# Patient Record
Sex: Male | Born: 1965 | Race: White | Hispanic: No | Marital: Married | Smoking: Never smoker
Health system: Southern US, Community
[De-identification: ages and names within clinical notes are randomized; demographics above are authoritative.]

## PROBLEM LIST (undated history)

## (undated) DIAGNOSIS — L409 Psoriasis, unspecified: Secondary | ICD-10-CM

## (undated) DIAGNOSIS — I10 Essential (primary) hypertension: Secondary | ICD-10-CM

## (undated) DIAGNOSIS — K219 Gastro-esophageal reflux disease without esophagitis: Secondary | ICD-10-CM

## (undated) DIAGNOSIS — E785 Hyperlipidemia, unspecified: Secondary | ICD-10-CM

## (undated) HISTORY — PX: SKIN CANCER EXCISION: SHX779

## (undated) HISTORY — DX: Gastro-esophageal reflux disease without esophagitis: K21.9

## (undated) HISTORY — DX: Hyperlipidemia, unspecified: E78.5

## (undated) HISTORY — DX: Psoriasis, unspecified: L40.9

---
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
## 2005-10-21 ENCOUNTER — Ambulatory Visit: Payer: Self-pay | Admitting: Family Medicine

## 2007-11-04 ENCOUNTER — Ambulatory Visit: Payer: Self-pay | Admitting: Family Medicine

## 2007-11-04 LAB — CONVERTED CEMR LAB
Bilirubin Urine: NEGATIVE
Blood in Urine, dipstick: NEGATIVE
Glucose, Urine, Semiquant: NEGATIVE
Specific Gravity, Urine: 1.015
WBC Urine, dipstick: NEGATIVE

## 2007-11-10 ENCOUNTER — Ambulatory Visit: Payer: Self-pay | Admitting: Family Medicine

## 2007-11-15 LAB — CONVERTED CEMR LAB
AST: 18 units/L (ref 0–37)
Bilirubin, Direct: 0.2 mg/dL (ref 0.0–0.3)
Cholesterol: 189 mg/dL (ref 0–200)
Eosinophils Absolute: 0.1 10*3/uL (ref 0.0–0.6)
Eosinophils Relative: 3 % (ref 0.0–5.0)
GFR calc Af Amer: 120 mL/min
GFR calc non Af Amer: 99 mL/min
Glucose, Bld: 93 mg/dL (ref 70–99)
HCT: 44.3 % (ref 39.0–52.0)
Lymphocytes Relative: 30.8 % (ref 12.0–46.0)
MCV: 89.4 fL (ref 78.0–100.0)
Neutro Abs: 2.4 10*3/uL (ref 1.4–7.7)
Neutrophils Relative %: 54.5 % (ref 43.0–77.0)
Potassium: 4.6 meq/L (ref 3.5–5.1)
Sodium: 139 meq/L (ref 135–145)
TSH: 1.3 microintl units/mL (ref 0.35–5.50)
Triglycerides: 42 mg/dL (ref 0–149)
WBC: 4.4 10*3/uL — ABNORMAL LOW (ref 4.5–10.5)

## 2008-05-27 ENCOUNTER — Emergency Department (HOSPITAL_COMMUNITY): Admission: EM | Admit: 2008-05-27 | Discharge: 2008-05-27 | Payer: Self-pay | Admitting: Emergency Medicine

## 2008-08-21 ENCOUNTER — Ambulatory Visit: Payer: Self-pay | Admitting: Family Medicine

## 2008-08-21 LAB — CONVERTED CEMR LAB
Bilirubin Urine: NEGATIVE
Ketones, urine, test strip: NEGATIVE
Nitrite: NEGATIVE
Specific Gravity, Urine: 1.01
pH: 6.5

## 2008-08-23 LAB — CONVERTED CEMR LAB
ALT: 14 units/L (ref 0–53)
AST: 16 units/L (ref 0–37)
Albumin: 4.2 g/dL (ref 3.5–5.2)
Alkaline Phosphatase: 37 units/L — ABNORMAL LOW (ref 39–117)
BUN: 11 mg/dL (ref 6–23)
Basophils Absolute: 0 10*3/uL (ref 0.0–0.1)
Basophils Relative: 0.6 % (ref 0.0–3.0)
Bilirubin, Direct: 0.1 mg/dL (ref 0.0–0.3)
CO2: 32 meq/L (ref 19–32)
Calcium: 8.9 mg/dL (ref 8.4–10.5)
Chloride: 111 meq/L (ref 96–112)
Cholesterol: 149 mg/dL (ref 0–200)
Creatinine, Ser: 0.8 mg/dL (ref 0.4–1.5)
Eosinophils Absolute: 0.1 10*3/uL (ref 0.0–0.7)
Eosinophils Relative: 3.7 % (ref 0.0–5.0)
GFR calc Af Amer: 137 mL/min
GFR calc non Af Amer: 113 mL/min
Glucose, Bld: 92 mg/dL (ref 70–99)
HCT: 42.2 % (ref 39.0–52.0)
HDL: 28.8 mg/dL — ABNORMAL LOW (ref >39.0)
Hemoglobin: 14.3 g/dL (ref 13.0–17.0)
LDL Cholesterol: 110 mg/dL — ABNORMAL HIGH (ref 0–99)
Lymphocytes Relative: 43.4 % (ref 12.0–46.0)
MCHC: 33.9 g/dL (ref 30.0–36.0)
MCV: 91 fL (ref 78.0–100.0)
Monocytes Absolute: 0.4 10*3/uL (ref 0.1–1.0)
Monocytes Relative: 12.8 % — ABNORMAL HIGH (ref 3.0–12.0)
Neutro Abs: 1.2 10*3/uL — ABNORMAL LOW (ref 1.4–7.7)
Neutrophils Relative %: 39.5 % — ABNORMAL LOW (ref 43.0–77.0)
Platelets: 147 10*3/uL — ABNORMAL LOW (ref 150–400)
Potassium: 4.3 meq/L (ref 3.5–5.1)
RBC: 4.64 M/uL (ref 4.22–5.81)
RDW: 12.3 % (ref 11.5–14.6)
Sodium: 144 meq/L (ref 135–145)
TSH: 1.82 microintl units/mL (ref 0.35–5.50)
Total Bilirubin: 0.9 mg/dL (ref 0.3–1.2)
Total CHOL/HDL Ratio: 5.2
Total Protein: 6.7 g/dL (ref 6.0–8.3)
Triglycerides: 50 mg/dL (ref 0–149)
VLDL: 10 mg/dL (ref 0–40)
WBC: 3.1 10*3/uL — ABNORMAL LOW (ref 4.5–10.5)

## 2008-08-28 ENCOUNTER — Ambulatory Visit: Payer: Self-pay | Admitting: Family Medicine

## 2008-10-16 ENCOUNTER — Telehealth: Payer: Self-pay | Admitting: Family Medicine

## 2008-12-15 ENCOUNTER — Encounter: Payer: Self-pay | Admitting: Family Medicine

## 2009-04-03 ENCOUNTER — Ambulatory Visit: Payer: Self-pay | Admitting: Family Medicine

## 2009-04-03 DIAGNOSIS — J019 Acute sinusitis, unspecified: Secondary | ICD-10-CM | POA: Insufficient documentation

## 2009-11-13 ENCOUNTER — Ambulatory Visit: Payer: Self-pay | Admitting: Family Medicine

## 2009-11-16 LAB — CONVERTED CEMR LAB
Albumin: 4.5 g/dL (ref 3.5–5.2)
Alkaline Phosphatase: 45 units/L (ref 39–117)
Basophils Absolute: 0 10*3/uL (ref 0.0–0.1)
CO2: 32 meq/L (ref 19–32)
Calcium: 9.4 mg/dL (ref 8.4–10.5)
Cholesterol: 191 mg/dL (ref 0–200)
Creatinine, Ser: 0.9 mg/dL (ref 0.4–1.5)
Eosinophils Absolute: 0 10*3/uL (ref 0.0–0.7)
Glucose, Bld: 93 mg/dL (ref 70–99)
HDL: 38.2 mg/dL — ABNORMAL LOW (ref >39.00)
Lymphocytes Relative: 23.6 % (ref 12.0–46.0)
MCHC: 33.9 g/dL (ref 30.0–36.0)
MCV: 90.4 fL (ref 78.0–100.0)
Monocytes Absolute: 0.4 10*3/uL (ref 0.1–1.0)
Neutro Abs: 3.4 10*3/uL (ref 1.4–7.7)
Neutrophils Relative %: 66.6 % (ref 43.0–77.0)
RDW: 13 % (ref 11.5–14.6)
Triglycerides: 78 mg/dL (ref 0.0–149.0)

## 2009-11-19 ENCOUNTER — Ambulatory Visit: Payer: Self-pay | Admitting: Family Medicine

## 2009-11-19 DIAGNOSIS — E785 Hyperlipidemia, unspecified: Secondary | ICD-10-CM | POA: Insufficient documentation

## 2009-11-19 LAB — CONVERTED CEMR LAB
Bilirubin Urine: NEGATIVE
Glucose, Urine, Semiquant: NEGATIVE
Ketones, urine, test strip: NEGATIVE
Specific Gravity, Urine: >=1.03
Urobilinogen, UA: 0.2
pH: 5

## 2010-11-13 ENCOUNTER — Ambulatory Visit: Payer: Self-pay | Admitting: Family Medicine

## 2010-11-15 LAB — CONVERTED CEMR LAB
ALT: 22 units/L (ref 0–53)
BUN: 12 mg/dL (ref 6–23)
Bilirubin Urine: NEGATIVE
Bilirubin, Direct: 0.1 mg/dL (ref 0.0–0.3)
Blood, UA: NEGATIVE
CO2: 30 meq/L (ref 19–32)
Chloride: 105 meq/L (ref 96–112)
Cholesterol: 182 mg/dL (ref 0–200)
Creatinine, Ser: 0.9 mg/dL (ref 0.4–1.5)
Eosinophils Absolute: 0.1 10*3/uL (ref 0.0–0.7)
Eosinophils Relative: 3.4 % (ref 0.0–5.0)
Glucose, Bld: 96 mg/dL (ref 70–99)
HCT: 44.3 % (ref 39.0–52.0)
LDL Cholesterol: 141 mg/dL — ABNORMAL HIGH (ref 0–99)
Leukocytes, UA: NEGATIVE
Lymphs Abs: 1.3 10*3/uL (ref 0.7–4.0)
MCHC: 34.9 g/dL (ref 30.0–36.0)
MCV: 89.3 fL (ref 78.0–100.0)
Monocytes Absolute: 0.4 10*3/uL (ref 0.1–1.0)
Neutrophils Relative %: 49.2 % (ref 43.0–77.0)
Nitrite: NEGATIVE
PSA: 0.47 ng/mL (ref 0.10–4.00)
Platelets: 169 10*3/uL (ref 150.0–400.0)
Potassium: 4.5 meq/L (ref 3.5–5.1)
RDW: 13.7 % (ref 11.5–14.6)
TSH: 1.14 microintl units/mL (ref 0.35–5.50)
Total Bilirubin: 0.6 mg/dL (ref 0.3–1.2)
Total Protein, Urine: NEGATIVE mg/dL
Triglycerides: 35 mg/dL (ref 0.0–149.0)
pH: 6.5 (ref 5.0–8.0)

## 2010-11-20 ENCOUNTER — Ambulatory Visit: Payer: Self-pay | Admitting: Family Medicine

## 2010-12-30 ENCOUNTER — Encounter: Payer: Self-pay | Admitting: Family Medicine

## 2010-12-30 ENCOUNTER — Telehealth: Payer: Self-pay | Admitting: Family Medicine

## 2011-01-02 NOTE — Assessment & Plan Note (Signed)
Summary: CPX // RS/pt rescd from bump//ccm   Vital Signs:  Patient profile:   45 year old male Height:      73.5 inches Weight:      187 pounds BMI:     24.43 O2 Sat:      96 % Temp:     98.4 degrees F BP sitting:   114 / 70  (left arm) Cuff size:   regular  Vitals Entered By: Pura Spice, RN (November 20, 2010 2:36 PM) CC: cpx    History of Present Illness: 45 yr old male for a cpx. he feels well and has no complaints.   Allergies (verified): No Known Drug Allergies  Past History:  Past Medical History: Reviewed history from 11/19/2009 and no changes required. psoriasis Hyperlipidemia  Past Surgical History: Reviewed history from 11/10/2007 and no changes required. 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 Family History Hypertension Family History of Prostate CA 1st degree relative <50  Social History: Reviewed history from 08/28/2008 and no changes required. Married Never Smoked Alcohol use-yes Drug use-no Regular exercise-yes OccupationPublishing rights manager of physical education at [employer]  Review of Systems  The patient denies anorexia, fever, weight loss, weight gain, vision loss, decreased hearing, hoarseness, chest pain, syncope, dyspnea on exertion, peripheral edema, prolonged cough, headaches, hemoptysis, abdominal pain, melena, hematochezia, severe indigestion/heartburn, hematuria, incontinence, genital sores, muscle weakness, suspicious skin lesions, transient blindness, difficulty walking, depression, unusual weight change, abnormal bleeding, enlarged lymph nodes, angioedema, breast masses, and testicular masses.    Physical Exam  General:  Well-developed,well-nourished,in no acute distress; alert,appropriate and cooperative throughout examination Head:  Normocephalic and atraumatic without obvious abnormalities. No apparent alopecia or balding. Eyes:  No corneal  or conjunctival inflammation noted. EOMI. Perrla. Funduscopic exam benign, without hemorrhages, exudates or papilledema. Vision grossly normal. Ears:  External ear exam shows no significant lesions or deformities.  Otoscopic examination reveals clear canals, tympanic membranes are intact bilaterally without bulging, retraction, inflammation or discharge. Hearing is grossly normal bilaterally. Nose:  External nasal examination shows no deformity or inflammation. Nasal mucosa are pink and moist without lesions or exudates. Mouth:  Oral mucosa and oropharynx without lesions or exudates.  Teeth in good repair. Neck:  No deformities, masses, or tenderness noted. Chest Wall:  No deformities, masses, tenderness or gynecomastia noted. Lungs:  Normal respiratory effort, chest expands symmetrically. Lungs are clear to auscultation, no crackles or wheezes. Heart:  Normal rate and regular rhythm. S1 and S2 normal without gallop, murmur, click, rub or other extra sounds. Abdomen:  Bowel sounds positive,abdomen soft and non-tender without masses, organomegaly or hernias noted. Genitalia:  Testes bilaterally descended without nodularity, tenderness or masses. No scrotal masses or lesions. No penis lesions or urethral discharge. Msk:  No deformity or scoliosis noted of thoracic or lumbar spine.   Pulses:  R and L carotid,radial,femoral,dorsalis pedis and posterior tibial pulses are full and equal bilaterally Extremities:  No clubbing, cyanosis, edema, or deformity noted with normal full range of motion of all joints.   Neurologic:  No cranial nerve deficits noted. Station and gait are normal. Plantar reflexes are down-going bilaterally. DTRs are symmetrical throughout. Sensory, motor and coordinative functions appear intact. Skin:  Intact without suspicious lesions or rashes Cervical Nodes:  No lymphadenopathy noted Axillary Nodes:  No palpable lymphadenopathy Inguinal Nodes:  No significant adenopathy Psych:   Cognition and judgment appear intact. Alert and cooperative with normal attention span  and concentration. No apparent delusions, illusions, hallucinations   Impression & Recommendations:  Problem # 1:  WELL ADULT EXAM (ICD-V70.0)  Other Orders: Tdap => 27yrs IM (16109) Admin 1st Vaccine (60454)  Patient Instructions: 1)  Please schedule a follow-up appointment in 1 year.    Orders Added: 1)  Tdap => 68yrs IM [90715] 2)  Admin 1st Vaccine [90471] 3)  Est. Patient 40-64 years [09811]   Immunizations Administered:  Tetanus Vaccine:    Vaccine Type: Tdap    Site: left deltoid    Mfr: GlaxoSmithKline    Dose: 0.5 ml    Route: IM    Given by: Pura Spice, RN    Exp. Date: 09/19/2012    Lot #: [id number]    VIS given: 10/18/08 version given November 20, 2010.   Immunizations Administered:  Tetanus Vaccine:    Vaccine Type: Tdap    Site: left deltoid    Mfr: GlaxoSmithKline    Dose: 0.5 ml    Route: IM    Given by: Pura Spice, RN    Exp. Date: 09/19/2012    Lot #: [id number]    VIS given: 10/18/08 version given November 20, 2010.

## 2011-01-08 NOTE — Progress Notes (Signed)
Summary: Need letter of phyical date.  Phone Note Call from Patient   Caller: Patient Summary of Call: Patient is in need of a letter stating when his physical was completed. - He needs it for his insurance.  As per patient - this is his second request for this information.  Please call patient as soon as this is completed and he will pick it up.  [phone] - call if there is any problem.  Thanks Initial call taken by: Everrett Coombe,  December 30, 2010 1:28 PM  Follow-up for Phone Call        done, in your box  Follow-up by: Nelwyn Salisbury MD,  December 30, 2010 4:00 PM  Additional Follow-up for Phone Call Additional follow up Details #1::        copy made scanned  pt aware to pick up  Additional Follow-up by: Pura Spice, RN,  December 30, 2010 4:39 PM

## 2011-01-08 NOTE — Letter (Signed)
Summary: Proof of Physical Letter   Proof of Physical Letter   Imported By: Maryln Gottron 01/03/2011 10:09:21  _____________________________________________________________________  External Attachment:    Type:   Image     Comment:   External Document

## 2011-04-15 NOTE — Assessment & Plan Note (Signed)
[facility]                                 ON-CALL NOTE   NAME:WENGERMozes, Sagar                         MRN:          [id number]  DATE:05/27/2008                            DOB:          1966/01/07    Phone number is [phone]   OBJECTIVE:  The patient is short of breath for a couple of days, has had  tingling in the extremities.  His chest has been heavy with back  discomfort and shortness of breath.  Indeed sound short of breath on the  phone; claims his chest pain is exertional, sounds like he needs to go  the emergency room, which he was told to do.  Primary care Shacora Zynda is  Dr. Clent Ridges and home office is [place].     Arta Silence, MD  Electronically Signed    RNS/MedQ  DD: 05/27/2008  DT: 05/28/2008  Job #: [id number]

## 2011-08-28 LAB — DIFFERENTIAL
Basophils Absolute: 0
Basophils Relative: 0
Lymphocytes Relative: 11 — ABNORMAL LOW
Neutro Abs: 6.2
Neutrophils Relative %: 82 — ABNORMAL HIGH

## 2011-08-28 LAB — POCT I-STAT, CHEM 8
Calcium, Ion: 1.16
Glucose, Bld: 92
HCT: 47
Hemoglobin: 16
TCO2: 29

## 2011-08-28 LAB — CBC
MCHC: 34.1
RBC: 5.07
RDW: 13.3

## 2011-08-28 LAB — POCT CARDIAC MARKERS
CKMB, poc: 1.1
Myoglobin, poc: 72.5
Operator id: 196461
Troponin i, poc: <0.05

## 2011-11-17 ENCOUNTER — Other Ambulatory Visit (INDEPENDENT_AMBULATORY_CARE_PROVIDER_SITE_OTHER): Payer: 59

## 2011-11-17 DIAGNOSIS — Z0000 Encounter for general adult medical examination without abnormal findings: Secondary | ICD-10-CM

## 2011-11-17 LAB — BASIC METABOLIC PANEL
CO2: 28 mEq/L (ref 19–32)
Calcium: 8.9 mg/dL (ref 8.4–10.5)
Creatinine, Ser: 1 mg/dL (ref 0.4–1.5)
GFR: 91.32 mL/min (ref >60.00)

## 2011-11-17 LAB — HEPATIC FUNCTION PANEL
Alkaline Phosphatase: 47 U/L (ref 39–117)
Bilirubin, Direct: 0.1 mg/dL (ref 0.0–0.3)
Total Protein: 6.9 g/dL (ref 6.0–8.3)

## 2011-11-17 LAB — POCT URINALYSIS DIPSTICK
Blood, UA: NEGATIVE
Protein, UA: NEGATIVE
Spec Grav, UA: 1.02
Urobilinogen, UA: 0.2

## 2011-11-17 LAB — CBC WITH DIFFERENTIAL/PLATELET
Basophils Relative: 0.8 % (ref 0.0–3.0)
Eosinophils Absolute: 0.1 10*3/uL (ref 0.0–0.7)
Lymphocytes Relative: 33.3 % (ref 12.0–46.0)
MCHC: 34.3 g/dL (ref 30.0–36.0)
Monocytes Relative: 11.6 % (ref 3.0–12.0)
Neutrophils Relative %: 52 % (ref 43.0–77.0)
RBC: 4.79 Mil/uL (ref 4.22–5.81)
WBC: 3.6 10*3/uL — ABNORMAL LOW (ref 4.5–10.5)

## 2011-11-17 LAB — LIPID PANEL
HDL: 37.4 mg/dL — ABNORMAL LOW (ref >39.00)
LDL Cholesterol: 141 mg/dL — ABNORMAL HIGH (ref 0–99)
Total CHOL/HDL Ratio: 5
Triglycerides: 52 mg/dL (ref 0.0–149.0)

## 2011-11-20 NOTE — Progress Notes (Signed)
Quick Note:  Left voice message ______ 

## 2011-12-03 ENCOUNTER — Encounter: Payer: Self-pay | Admitting: Family Medicine

## 2011-12-04 ENCOUNTER — Ambulatory Visit (INDEPENDENT_AMBULATORY_CARE_PROVIDER_SITE_OTHER): Payer: 59 | Admitting: Family Medicine

## 2011-12-04 ENCOUNTER — Encounter: Payer: Self-pay | Admitting: Family Medicine

## 2011-12-04 VITALS — BP 120/84 | HR 58 | Temp 97.8°F | Ht 73.0 in | Wt 199.0 lb

## 2011-12-04 DIAGNOSIS — Z0000 Encounter for general adult medical examination without abnormal findings: Secondary | ICD-10-CM

## 2011-12-04 MED ORDER — ATORVASTATIN CALCIUM 20 MG PO TABS: 20.0000 mg | ORAL_TABLET | Freq: Every day | ORAL | Status: DC

## 2011-12-04 NOTE — Progress Notes (Signed)
  Subjective:    Patient ID: Sean Marshall, male    DOB: [birth year], 46 y.o.   MRN: [id number]  HPI 46 yr old male for a cpx. He feels fine and has no complaints. He has been taking fish oil capsules for the past 3 months, but he hates to take them because they are so large and hard to swallow. He does watch his diet.    Review of Systems  Constitutional: Negative.   HENT: Negative.   Eyes: Negative.   Respiratory: Negative.   Cardiovascular: Negative.   Gastrointestinal: Negative.   Genitourinary: Negative.   Musculoskeletal: Negative.   Skin: Negative.   Neurological: Negative.   Hematological: Negative.   Psychiatric/Behavioral: Negative.        Objective:   Physical Exam  Constitutional: He is oriented to person, place, and time. He appears well-developed and well-nourished. No distress.  HENT:  Head: Normocephalic and atraumatic.  Right Ear: External ear normal.  Left Ear: External ear normal.  Nose: Nose normal.  Mouth/Throat: Oropharynx is clear and moist. No oropharyngeal exudate.  Eyes: Conjunctivae and EOM are normal. Pupils are equal, round, and reactive to light. Right eye exhibits no discharge. Left eye exhibits no discharge. No scleral icterus.  Neck: Neck supple. No JVD present. No tracheal deviation present. No thyromegaly present.  Cardiovascular: Normal rate, regular rhythm, normal heart sounds and intact distal pulses.  Exam reveals no gallop and no friction rub.   No murmur heard. Pulmonary/Chest: Effort normal and breath sounds normal. No respiratory distress. He has no wheezes. He has no rales. He exhibits no tenderness.  Abdominal: Soft. Bowel sounds are normal. He exhibits no distension and no mass. There is no tenderness. There is no rebound and no guarding.  Genitourinary: Rectum normal, prostate normal and penis normal. Guaiac negative stool. No penile tenderness.  Musculoskeletal: Normal range of motion. He exhibits no edema and no tenderness.    Lymphadenopathy:    He has no cervical adenopathy.  Neurological: He is alert and oriented to person, place, and time. He has normal reflexes. No cranial nerve deficit. He exhibits normal muscle tone. Coordination normal.  Skin: Skin is warm and dry. No rash noted. He is not diaphoretic. No erythema. No pallor.  Psychiatric: He has a normal mood and affect. His behavior is normal. Judgment and thought content normal.          Assessment & Plan:  Well exam. We decided to stop the fish oil and to start on Lipitor. Recheck in 90 days

## 2012-01-23 ENCOUNTER — Other Ambulatory Visit: Payer: Self-pay | Admitting: Family Medicine

## 2012-03-02 ENCOUNTER — Other Ambulatory Visit: Payer: Self-pay | Admitting: Family Medicine

## 2012-03-04 ENCOUNTER — Telehealth: Payer: Self-pay | Admitting: Family Medicine

## 2012-03-04 DIAGNOSIS — E785 Hyperlipidemia, unspecified: Secondary | ICD-10-CM

## 2012-03-04 NOTE — Telephone Encounter (Signed)
Pt called and said that Dr Clent Ridges had mentioned about pt coming in about 3 month from last visit to get a recheck done on cholesterol and liver, due to a med he was on. Pls order the needed labs and advise is pt needs a fup ov after labs.

## 2012-03-07 NOTE — Telephone Encounter (Signed)
Please order a liver panel and lipids for 272.4. No OV is needed

## 2012-03-09 NOTE — Telephone Encounter (Signed)
Pt informed, future labs ordered 

## 2012-03-10 ENCOUNTER — Other Ambulatory Visit (INDEPENDENT_AMBULATORY_CARE_PROVIDER_SITE_OTHER): Payer: 59

## 2012-03-10 DIAGNOSIS — E785 Hyperlipidemia, unspecified: Secondary | ICD-10-CM

## 2012-03-10 LAB — LIPID PANEL
HDL: 37.5 mg/dL — ABNORMAL LOW (ref >39.00)
LDL Cholesterol: 65 mg/dL (ref 0–99)
Total CHOL/HDL Ratio: 3
VLDL: 7.6 mg/dL (ref 0.0–40.0)

## 2012-03-10 LAB — HEPATIC FUNCTION PANEL
AST: 30 U/L (ref 0–37)
Alkaline Phosphatase: 52 U/L (ref 39–117)

## 2012-03-12 ENCOUNTER — Telehealth: Payer: Self-pay | Admitting: Family Medicine

## 2012-03-12 NOTE — Telephone Encounter (Signed)
Pt called req to get lab results. Pls call.  °

## 2012-03-12 NOTE — Telephone Encounter (Signed)
Spoke with pt

## 2012-03-15 ENCOUNTER — Encounter: Payer: Self-pay | Admitting: Family Medicine

## 2012-03-15 NOTE — Progress Notes (Signed)
Quick Note:  I tried to reach pt by phone, no answer. I put a copy of results in mail. ______ 

## 2012-05-24 ENCOUNTER — Encounter: Payer: Self-pay | Admitting: Family Medicine

## 2012-05-24 ENCOUNTER — Ambulatory Visit (INDEPENDENT_AMBULATORY_CARE_PROVIDER_SITE_OTHER): Payer: 59 | Admitting: Family Medicine

## 2012-05-24 VITALS — BP 112/76 | HR 67 | Temp 98.5°F | Wt 192.0 lb

## 2012-05-24 DIAGNOSIS — J329 Chronic sinusitis, unspecified: Secondary | ICD-10-CM

## 2012-05-24 MED ORDER — AZITHROMYCIN 250 MG PO TABS: ORAL_TABLET | ORAL | Status: AC

## 2012-05-24 NOTE — Progress Notes (Signed)
  Subjective:    Patient ID: Sean Marshall, male    DOB: [birth year], 46 y.o.   MRN: [id number]  HPI Here for 3 weeks of sinus pressure, PND, ST, and coughing up green sputum. No fever. He has been taking some Prednisone he got from his wife. For the past 6 days he has been in a total of 40 mg a day.    Review of Systems  Constitutional: Negative.   HENT: Positive for congestion, postnasal drip and sinus pressure.   Eyes: Negative.   Respiratory: Positive for cough.        Objective:   Physical Exam  Constitutional: He appears well-developed and well-nourished.  HENT:  Right Ear: External ear normal.  Left Ear: External ear normal.  Nose: Nose normal.  Mouth/Throat: Oropharynx is clear and moist.  Eyes: Conjunctivae are normal.  Neck: No thyromegaly present.  Pulmonary/Chest: Effort normal and breath sounds normal.  Lymphadenopathy:    He has no cervical adenopathy.          Assessment & Plan:  Add a Zpack. He will taper off Prednisone by taking 20 mg a day for one week, then 10 mg a day for one week, then stop.

## 2012-05-28 ENCOUNTER — Ambulatory Visit (INDEPENDENT_AMBULATORY_CARE_PROVIDER_SITE_OTHER): Payer: 59 | Admitting: Family Medicine

## 2012-05-28 ENCOUNTER — Encounter: Payer: Self-pay | Admitting: Family Medicine

## 2012-05-28 VITALS — BP 112/68 | HR 76 | Temp 98.4°F | Wt 192.0 lb

## 2012-05-28 DIAGNOSIS — J45909 Unspecified asthma, uncomplicated: Secondary | ICD-10-CM

## 2012-05-28 MED ORDER — ALBUTEROL SULFATE HFA 108 (90 BASE) MCG/ACT IN AERS: 2.0000 | INHALATION_SPRAY | RESPIRATORY_TRACT | Status: DC | PRN

## 2012-06-07 ENCOUNTER — Encounter: Payer: Self-pay | Admitting: Family Medicine

## 2012-06-07 NOTE — Progress Notes (Signed)
  Subjective:    Patient ID: Sean Marshall, male    DOB: Aug 12, 1966, 46 y.o.   MRN: [id number]  HPI Here to follow up on a sinusitis diagnosed on 05-24-12. He was given a Zpack and he has been takinf some prednisone he got from his wife. He feels better in that the sinus pressure is gone and the PND is better. However he still has a lot of dry coughing and some wheezing. No fever. No hx of asthma as a child, but there is a strong family hx of asthma.    Review of Systems  Constitutional: Negative.   HENT: Negative.   Eyes: Negative.   Respiratory: Positive for cough, shortness of breath and wheezing. Negative for choking and chest tightness.   Cardiovascular: Negative.        Objective:   Physical Exam  Constitutional: He appears well-developed and well-nourished. No distress.  HENT:  Right Ear: External ear normal.  Left Ear: External ear normal.  Nose: Nose normal.  Mouth/Throat: Oropharynx is clear and moist. No oropharyngeal exudate.  Eyes: Conjunctivae are normal.  Neck: No thyromegaly present.  Cardiovascular: Normal rate, regular rhythm, normal heart sounds and intact distal pulses.   Pulmonary/Chest: Effort normal. No respiratory distress. He has no rales.       Soft scattered wheezes, no rhonchi  Lymphadenopathy:    He has no cervical adenopathy.          Assessment & Plan:  The sinsusitis [place] resolved but he seems to have some asthmatic bronchitis now. We will continue a slow prednisone taper and add an albuterol inhaler. Recheck prn

## 2013-01-26 ENCOUNTER — Telehealth: Payer: Self-pay | Admitting: Family Medicine

## 2013-01-26 MED ORDER — ATORVASTATIN CALCIUM 20 MG PO TABS: 20.0000 mg | ORAL_TABLET | Freq: Every day | ORAL | Status: DC

## 2013-01-26 NOTE — Telephone Encounter (Signed)
Refill request for Atorvastatin 20 mg and a 90 day supply, which I did send e-scribe

## 2013-04-28 ENCOUNTER — Other Ambulatory Visit (INDEPENDENT_AMBULATORY_CARE_PROVIDER_SITE_OTHER): Payer: 59

## 2013-04-28 DIAGNOSIS — Z0000 Encounter for general adult medical examination without abnormal findings: Secondary | ICD-10-CM

## 2013-04-28 LAB — POCT URINALYSIS DIPSTICK
Ketones, UA: NEGATIVE
Protein, UA: NEGATIVE
Spec Grav, UA: 1.02
Urobilinogen, UA: 1
pH, UA: 7

## 2013-04-28 LAB — BASIC METABOLIC PANEL
BUN: 11 mg/dL (ref 6–23)
Chloride: 102 mEq/L (ref 96–112)
GFR: 88.57 mL/min (ref >60.00)
Glucose, Bld: 98 mg/dL (ref 70–99)
Potassium: 4.7 mEq/L (ref 3.5–5.1)
Sodium: 136 mEq/L (ref 135–145)

## 2013-04-28 LAB — CBC WITH DIFFERENTIAL/PLATELET
Basophils Relative: 1 % (ref 0.0–3.0)
Eosinophils Relative: 1.7 % (ref 0.0–5.0)
Lymphocytes Relative: 31.9 % (ref 12.0–46.0)
MCV: 88.4 fl (ref 78.0–100.0)
Monocytes Absolute: 0.5 10*3/uL (ref 0.1–1.0)
Neutrophils Relative %: 52.9 % (ref 43.0–77.0)
Platelets: 149 10*3/uL — ABNORMAL LOW (ref 150.0–400.0)
RBC: 4.95 Mil/uL (ref 4.22–5.81)
WBC: 4.1 10*3/uL — ABNORMAL LOW (ref 4.5–10.5)

## 2013-04-28 LAB — HEPATIC FUNCTION PANEL
AST: 24 U/L (ref 0–37)
Albumin: 4.2 g/dL (ref 3.5–5.2)
Alkaline Phosphatase: 48 U/L (ref 39–117)
Total Protein: 6.9 g/dL (ref 6.0–8.3)

## 2013-04-28 LAB — LIPID PANEL
Cholesterol: 108 mg/dL (ref 0–200)
HDL: 41 mg/dL (ref >39.00)
LDL Cholesterol: 59 mg/dL (ref 0–99)
VLDL: 7.6 mg/dL (ref 0.0–40.0)

## 2013-04-29 NOTE — Progress Notes (Signed)
Quick Note:  Pt has appointment on 05/04/13 will go over then. ______ 

## 2013-05-04 ENCOUNTER — Encounter: Payer: Self-pay | Admitting: Family Medicine

## 2013-05-04 ENCOUNTER — Ambulatory Visit (INDEPENDENT_AMBULATORY_CARE_PROVIDER_SITE_OTHER): Payer: 59 | Admitting: Family Medicine

## 2013-05-04 VITALS — BP 108/74 | HR 68 | Temp 98.2°F | Ht 73.0 in | Wt 194.0 lb

## 2013-05-04 DIAGNOSIS — Z0000 Encounter for general adult medical examination without abnormal findings: Secondary | ICD-10-CM

## 2013-05-04 LAB — PSA: PSA: 0.53 ng/mL (ref 0.10–4.00)

## 2013-05-04 NOTE — Progress Notes (Signed)
  Subjective:    Patient ID: Sean Marshall, male    DOB: [birth year], 47 y.o.   MRN: [id number]  HPI 47 yr old male for a cpx. He feels fine and has no complaints.    Review of Systems  Constitutional: Negative.   HENT: Negative.   Eyes: Negative.   Respiratory: Negative.   Cardiovascular: Negative.   Gastrointestinal: Negative.   Genitourinary: Negative.   Musculoskeletal: Negative.   Skin: Negative.   Neurological: Negative.   Psychiatric/Behavioral: Negative.        Objective:   Physical Exam  Constitutional: He is oriented to person, place, and time. He appears well-developed and well-nourished. No distress.  HENT:  Head: Normocephalic and atraumatic.  Right Ear: External ear normal.  Left Ear: External ear normal.  Nose: Nose normal.  Mouth/Throat: Oropharynx is clear and moist. No oropharyngeal exudate.  Eyes: Conjunctivae and EOM are normal. Pupils are equal, round, and reactive to light. Right eye exhibits no discharge. Left eye exhibits no discharge. No scleral icterus.  Neck: Neck supple. No JVD present. No tracheal deviation present. No thyromegaly present.  Cardiovascular: Normal rate, regular rhythm, normal heart sounds and intact distal pulses.  Exam reveals no gallop and no friction rub.   No murmur heard. Pulmonary/Chest: Effort normal and breath sounds normal. No respiratory distress. He has no wheezes. He has no rales. He exhibits no tenderness.  Abdominal: Soft. Bowel sounds are normal. He exhibits no distension and no mass. There is no tenderness. There is no rebound and no guarding.  Genitourinary: Rectum normal, prostate normal and penis normal. Guaiac negative stool. No penile tenderness.  Musculoskeletal: Normal range of motion. He exhibits no edema and no tenderness.  Lymphadenopathy:    He has no cervical adenopathy.  Neurological: He is alert and oriented to person, place, and time. He has normal reflexes. No cranial nerve deficit. He exhibits normal  muscle tone. Coordination normal.  Skin: Skin is warm and dry. No rash noted. He is not diaphoretic. No erythema. No pallor.  Psychiatric: He has a normal mood and affect. His behavior is normal. Judgment and thought content normal.          Assessment & Plan:  Well exam. Get a PSA today due to his family hx of prostate cancer.

## 2013-05-05 NOTE — Progress Notes (Signed)
Quick Note:  I spoke with pt ______ 

## 2013-05-19 ENCOUNTER — Other Ambulatory Visit: Payer: Self-pay | Admitting: Family Medicine

## 2013-08-15 ENCOUNTER — Ambulatory Visit (INDEPENDENT_AMBULATORY_CARE_PROVIDER_SITE_OTHER): Payer: 59 | Admitting: Family Medicine

## 2013-08-15 ENCOUNTER — Encounter: Payer: Self-pay | Admitting: Family Medicine

## 2013-08-15 VITALS — BP 116/60 | HR 66 | Temp 98.0°F | Wt 199.0 lb

## 2013-08-15 DIAGNOSIS — L989 Disorder of the skin and subcutaneous tissue, unspecified: Secondary | ICD-10-CM

## 2013-08-15 NOTE — Progress Notes (Signed)
  Subjective:    Patient ID: Sean Marshall, male    DOB: 1966-06-07, 47 y.o.   MRN: [id number]  HPI Here to look at an irritated lesion on his back. He noticed it about 3 weeks ago, and it itches and burns. He scratched it off and it grew back.    Review of Systems  Constitutional: Negative.        Objective:   Physical Exam  Constitutional: He appears well-developed and well-nourished.  Skin:  He has an inflamed tan papular lesion on the upper back that appears to be a seborrheic keratosis           Assessment & Plan:  We will plan to excise this in the near future.

## 2013-08-18 ENCOUNTER — Other Ambulatory Visit: Payer: Self-pay | Admitting: Family Medicine

## 2013-08-19 ENCOUNTER — Encounter: Payer: Self-pay | Admitting: Family Medicine

## 2013-08-19 ENCOUNTER — Ambulatory Visit (INDEPENDENT_AMBULATORY_CARE_PROVIDER_SITE_OTHER): Payer: 59 | Admitting: Family Medicine

## 2013-08-19 VITALS — BP 118/60 | HR 62 | Temp 97.9°F | Wt 198.0 lb

## 2013-08-19 DIAGNOSIS — L989 Disorder of the skin and subcutaneous tissue, unspecified: Secondary | ICD-10-CM

## 2013-08-21 ENCOUNTER — Encounter: Payer: Self-pay | Admitting: Family Medicine

## 2013-08-21 NOTE — Progress Notes (Signed)
  Subjective:    Patient ID: Sean Marshall, male    DOB: Apr 11, 1966, 47 y.o.   MRN: [id number]  HPI Here to remove a lesion on the back. This appears to be an inflamed seborrheic keratosis. It measures about 1.7 cm in diameter.    Review of Systems  Constitutional: Negative.        Objective:   Physical Exam  Constitutional: He appears well-developed.          Assessment & Plan:  The area was cleansed with Betadine and LA was achieved using 2% Xylocaine with epinephrine. An elliptical incision was made around the lesion down to the subcutaneous fat layer with a scalpel. The lesion was removed and sent to Pathology. The wound was closed using 5 sutures of 4-0 Ethilon. It was dressed with Neosporin and a Bandaid. The sutures will be removed in 14 days

## 2013-08-24 NOTE — Progress Notes (Signed)
Quick Note:  I spoke with pt ______ 

## 2014-06-15 ENCOUNTER — Other Ambulatory Visit: Payer: Self-pay | Admitting: Family Medicine

## 2014-07-05 ENCOUNTER — Other Ambulatory Visit: Payer: 59

## 2014-07-05 ENCOUNTER — Other Ambulatory Visit (INDEPENDENT_AMBULATORY_CARE_PROVIDER_SITE_OTHER): Payer: 59

## 2014-07-05 DIAGNOSIS — Z0000 Encounter for general adult medical examination without abnormal findings: Secondary | ICD-10-CM

## 2014-07-05 LAB — CBC WITH DIFFERENTIAL/PLATELET
BASOS ABS: 0 10*3/uL (ref 0.0–0.1)
BASOS PCT: 0.6 % (ref 0.0–3.0)
EOS ABS: 0.1 10*3/uL (ref 0.0–0.7)
Eosinophils Relative: 2 % (ref 0.0–5.0)
HEMATOCRIT: 43.6 % (ref 39.0–52.0)
HEMOGLOBIN: 14.8 g/dL (ref 13.0–17.0)
LYMPHS ABS: 1.4 10*3/uL (ref 0.7–4.0)
Lymphocytes Relative: 33.6 % (ref 12.0–46.0)
MCHC: 34 g/dL (ref 30.0–36.0)
MCV: 88.1 fl (ref 78.0–100.0)
Monocytes Absolute: 0.4 10*3/uL (ref 0.1–1.0)
Monocytes Relative: 9.8 % (ref 3.0–12.0)
NEUTROS ABS: 2.2 10*3/uL (ref 1.4–7.7)
Neutrophils Relative %: 54 % (ref 43.0–77.0)
Platelets: 169 10*3/uL (ref 150.0–400.0)
RBC: 4.95 Mil/uL (ref 4.22–5.81)
RDW: 14.3 % (ref 11.5–15.5)
WBC: 4.1 10*3/uL (ref 4.0–10.5)

## 2014-07-05 LAB — POCT URINALYSIS DIPSTICK
Bilirubin, UA: NEGATIVE
Blood, UA: NEGATIVE
Glucose, UA: NEGATIVE
KETONES UA: NEGATIVE
Leukocytes, UA: NEGATIVE
Nitrite, UA: NEGATIVE
PH UA: 7
Protein, UA: NEGATIVE
Spec Grav, UA: 1.015
Urobilinogen, UA: 0.2

## 2014-07-05 LAB — BASIC METABOLIC PANEL
BUN: 14 mg/dL (ref 6–23)
CHLORIDE: 102 meq/L (ref 96–112)
CO2: 27 meq/L (ref 19–32)
Calcium: 8.8 mg/dL (ref 8.4–10.5)
Creatinine, Ser: 1 mg/dL (ref 0.4–1.5)
GFR: 89.17 mL/min (ref >60.00)
GLUCOSE: 92 mg/dL (ref 70–99)
POTASSIUM: 3.9 meq/L (ref 3.5–5.1)
SODIUM: 135 meq/L (ref 135–145)

## 2014-07-05 LAB — HEPATIC FUNCTION PANEL
ALK PHOS: 50 U/L (ref 39–117)
ALT: 22 U/L (ref 0–53)
AST: 20 U/L (ref 0–37)
Albumin: 4.1 g/dL (ref 3.5–5.2)
BILIRUBIN DIRECT: 0.1 mg/dL (ref 0.0–0.3)
TOTAL PROTEIN: 6.9 g/dL (ref 6.0–8.3)
Total Bilirubin: 0.8 mg/dL (ref 0.2–1.2)

## 2014-07-05 LAB — LIPID PANEL
CHOLESTEROL: 119 mg/dL (ref 0–200)
HDL: 34.8 mg/dL — ABNORMAL LOW (ref >39.00)
LDL CALC: 75 mg/dL (ref 0–99)
NonHDL: 84.2
Total CHOL/HDL Ratio: 3
Triglycerides: 48 mg/dL (ref 0.0–149.0)
VLDL: 9.6 mg/dL (ref 0.0–40.0)

## 2014-07-05 LAB — TSH: TSH: 1.28 u[IU]/mL (ref 0.35–4.50)

## 2014-07-12 ENCOUNTER — Telehealth: Payer: Self-pay | Admitting: Family Medicine

## 2014-07-12 NOTE — Telephone Encounter (Signed)
Pt would like his lab results  

## 2014-07-12 NOTE — Telephone Encounter (Signed)
I spoke with pt and went over results. 

## 2014-07-14 ENCOUNTER — Other Ambulatory Visit: Payer: 59

## 2014-07-18 ENCOUNTER — Ambulatory Visit (INDEPENDENT_AMBULATORY_CARE_PROVIDER_SITE_OTHER): Payer: 59 | Admitting: Family Medicine

## 2014-07-18 ENCOUNTER — Encounter: Payer: Self-pay | Admitting: Family Medicine

## 2014-07-18 VITALS — BP 111/72 | HR 67 | Temp 98.4°F | Ht 73.5 in | Wt 202.0 lb

## 2014-07-18 DIAGNOSIS — Z0000 Encounter for general adult medical examination without abnormal findings: Secondary | ICD-10-CM

## 2014-07-18 LAB — PSA: PSA: 0.58 ng/mL (ref 0.10–4.00)

## 2014-07-18 MED ORDER — ATORVASTATIN CALCIUM 20 MG PO TABS: ORAL_TABLET | ORAL | Status: DC

## 2014-07-18 NOTE — Progress Notes (Signed)
Pre visit review using our clinic review tool, if applicable. No additional management support is needed unless otherwise documented below in the visit note. 

## 2014-07-18 NOTE — Progress Notes (Signed)
   Subjective:    Patient ID: Sean DonathBrian D Marshall, male    DOB: 04/29/1966, 48 y.o.   MRN: [id number]  HPI 48 yr old male for a cpx. He feels well.    Review of Systems  Constitutional: Negative.   HENT: Negative.   Eyes: Negative.   Respiratory: Negative.   Cardiovascular: Negative.   Gastrointestinal: Negative.   Genitourinary: Negative.   Musculoskeletal: Negative.   Skin: Negative.   Neurological: Negative.   Psychiatric/Behavioral: Negative.        Objective:   Physical Exam  Constitutional: He is oriented to person, place, and time. He appears well-developed and well-nourished. No distress.  HENT:  Head: Normocephalic and atraumatic.  Right Ear: External ear normal.  Left Ear: External ear normal.  Nose: Nose normal.  Mouth/Throat: Oropharynx is clear and moist. No oropharyngeal exudate.  Eyes: Conjunctivae and EOM are normal. Pupils are equal, round, and reactive to light. Right eye exhibits no discharge. Left eye exhibits no discharge. No scleral icterus.  Neck: Neck supple. No JVD present. No tracheal deviation present. No thyromegaly present.  Cardiovascular: Normal rate, regular rhythm, normal heart sounds and intact distal pulses.  Exam reveals no gallop and no friction rub.   No murmur heard. Pulmonary/Chest: Effort normal and breath sounds normal. No respiratory distress. He has no wheezes. He has no rales. He exhibits no tenderness.  Abdominal: Soft. Bowel sounds are normal. He exhibits no distension and no mass. There is no tenderness. There is no rebound and no guarding.  Genitourinary: Rectum normal, prostate normal and penis normal. Guaiac negative stool. No penile tenderness.  Musculoskeletal: Normal range of motion. He exhibits no edema and no tenderness.  Lymphadenopathy:    He has no cervical adenopathy.  Neurological: He is alert and oriented to person, place, and time. He has normal reflexes. No cranial nerve deficit. He exhibits normal muscle tone.  Coordination normal.  Skin: Skin is warm and dry. No rash noted. He is not diaphoretic. No erythema. No pallor.  Psychiatric: He has a normal mood and affect. His behavior is normal. Judgment and thought content normal.          Assessment & Plan:  Well exam.

## 2014-12-12 ENCOUNTER — Encounter: Payer: Self-pay | Admitting: Family Medicine

## 2014-12-12 ENCOUNTER — Ambulatory Visit (INDEPENDENT_AMBULATORY_CARE_PROVIDER_SITE_OTHER): Payer: 59 | Admitting: Family Medicine

## 2014-12-12 VITALS — BP 121/79 | HR 61 | Temp 98.1°F | Ht 73.5 in | Wt 205.0 lb

## 2014-12-12 DIAGNOSIS — M542 Cervicalgia: Secondary | ICD-10-CM

## 2014-12-12 MED ORDER — DICLOFENAC SODIUM 75 MG PO TBEC: 75.0000 mg | DELAYED_RELEASE_TABLET | Freq: Two times a day (BID) | ORAL | Status: DC

## 2014-12-12 MED ORDER — CYCLOBENZAPRINE HCL 10 MG PO TABS: 10.0000 mg | ORAL_TABLET | Freq: Three times a day (TID) | ORAL | Status: DC | PRN

## 2014-12-12 NOTE — Progress Notes (Signed)
   Subjective:    Patient ID: Sean Marshall, male    DOB: 05/03/1966, 49 y.o.   MRN: [id number]  HPI Here for 2 months of stiffness and pain in the neck which started when he was pitching batting practice at work. After one pitch a line drive was hit toward his head and he instinctively jerked his head back to protect himself. As he did so he heard and felt a loud "crack" and he felt a sharp pain in the neck. This has bothered him ever since. Sometimes the pain radiates up over the top of his head and into both temples and he develops a full headache. Advil or aspirin helps the pain somewhat.    Review of Systems  Constitutional: Negative.   Musculoskeletal: Positive for neck pain and neck stiffness.  Neurological: Positive for headaches.       Objective:   Physical Exam  Constitutional: He is oriented to person, place, and time. He appears well-developed and well-nourished.  Eyes: Conjunctivae and EOM are normal. Pupils are equal, round, and reactive to light.  Neck: No thyromegaly present.  Musculoskeletal:  He is tender along the posterior neck and into the tops of both trapezei. There is a lot of spasm and he has pain with full extension of the neck. Otherwise ROM is full.   Lymphadenopathy:    He has no cervical adenopathy.  Neurological: He is alert and oriented to person, place, and time. No cranial nerve deficit.          Assessment & Plan:  He is having tension headaches form pinhed nerves in the neck. I suspect he has a herniated cervical disc. Try Diclofenac and Flexeril, add moist heat. Get Xrays of the cervical spine. Unless these show a major problem I would anticipate referring him to PT. If so he would like to see Art Schulenklopper who is a friend of his.

## 2014-12-12 NOTE — Progress Notes (Signed)
Pre visit review using our clinic review tool, if applicable. No additional management support is needed unless otherwise documented below in the visit note. 

## 2014-12-13 ENCOUNTER — Ambulatory Visit (INDEPENDENT_AMBULATORY_CARE_PROVIDER_SITE_OTHER)
Admission: RE | Admit: 2014-12-13 | Discharge: 2014-12-13 | Disposition: A | Discharge status: Home / Self Care | Payer: 59 | Source: Ambulatory Visit | Attending: Family Medicine | Admitting: Family Medicine

## 2014-12-13 DIAGNOSIS — M542 Cervicalgia: Secondary | ICD-10-CM

## 2014-12-18 NOTE — Addendum Note (Signed)
Addended by: Gershon CraneFRY, STEPHEN A on: 12/18/2014 10:22 AM   Modules accepted: Orders

## 2015-06-12 ENCOUNTER — Telehealth: Payer: Self-pay | Admitting: Family Medicine

## 2015-06-12 NOTE — Telephone Encounter (Signed)
Patient came in wanted a RX refill on Atorvastatin 20 mg. [place] over to [facility] [place]. Please notify patient when the refill has been authorize.

## 2015-06-15 MED ORDER — ATORVASTATIN CALCIUM 20 MG PO TABS: ORAL_TABLET | ORAL | Status: DC

## 2015-06-15 NOTE — Telephone Encounter (Signed)
Script was sent e-scribe and I spoke with pt. 

## 2015-07-16 ENCOUNTER — Other Ambulatory Visit (INDEPENDENT_AMBULATORY_CARE_PROVIDER_SITE_OTHER): Payer: 59

## 2015-07-16 DIAGNOSIS — Z0000 Encounter for general adult medical examination without abnormal findings: Secondary | ICD-10-CM | POA: Diagnosis not present

## 2015-07-16 LAB — BASIC METABOLIC PANEL
BUN: 15 mg/dL (ref 6–23)
CHLORIDE: 103 meq/L (ref 96–112)
CO2: 29 meq/L (ref 19–32)
Calcium: 9 mg/dL (ref 8.4–10.5)
Creatinine, Ser: 1 mg/dL (ref 0.40–1.50)
GFR: 84.7 mL/min (ref >60.00)
GLUCOSE: 101 mg/dL — AB (ref 70–99)
POTASSIUM: 4.3 meq/L (ref 3.5–5.1)
Sodium: 139 mEq/L (ref 135–145)

## 2015-07-16 LAB — HEPATIC FUNCTION PANEL
ALBUMIN: 4.4 g/dL (ref 3.5–5.2)
ALT: 21 U/L (ref 0–53)
AST: 17 U/L (ref 0–37)
Alkaline Phosphatase: 55 U/L (ref 39–117)
BILIRUBIN TOTAL: 0.7 mg/dL (ref 0.2–1.2)
Bilirubin, Direct: 0.2 mg/dL (ref 0.0–0.3)
TOTAL PROTEIN: 6.8 g/dL (ref 6.0–8.3)

## 2015-07-16 LAB — CBC WITH DIFFERENTIAL/PLATELET
BASOS PCT: 0.7 % (ref 0.0–3.0)
Basophils Absolute: 0 10*3/uL (ref 0.0–0.1)
EOS PCT: 3.9 % (ref 0.0–5.0)
Eosinophils Absolute: 0.2 10*3/uL (ref 0.0–0.7)
HCT: 46 % (ref 39.0–52.0)
Hemoglobin: 15.5 g/dL (ref 13.0–17.0)
Lymphocytes Relative: 32 % (ref 12.0–46.0)
Lymphs Abs: 1.5 10*3/uL (ref 0.7–4.0)
MCHC: 33.8 g/dL (ref 30.0–36.0)
MCV: 89.2 fl (ref 78.0–100.0)
MONO ABS: 0.5 10*3/uL (ref 0.1–1.0)
Monocytes Relative: 11.6 % (ref 3.0–12.0)
Neutro Abs: 2.4 10*3/uL (ref 1.4–7.7)
Neutrophils Relative %: 51.8 % (ref 43.0–77.0)
Platelets: 177 10*3/uL (ref 150.0–400.0)
RBC: 5.16 Mil/uL (ref 4.22–5.81)
RDW: 13.6 % (ref 11.5–15.5)
WBC: 4.7 10*3/uL (ref 4.0–10.5)

## 2015-07-16 LAB — POCT URINALYSIS DIPSTICK
BILIRUBIN UA: NEGATIVE
Glucose, UA: NEGATIVE
KETONES UA: NEGATIVE
Leukocytes, UA: NEGATIVE
Nitrite, UA: NEGATIVE
PH UA: 6.5
PROTEIN UA: NEGATIVE
RBC UA: NEGATIVE
SPEC GRAV UA: 1.015
Urobilinogen, UA: 0.2

## 2015-07-16 LAB — LIPID PANEL
CHOL/HDL RATIO: 4
Cholesterol: 119 mg/dL (ref 0–200)
HDL: 33.7 mg/dL — ABNORMAL LOW (ref >39.00)
LDL Cholesterol: 74 mg/dL (ref 0–99)
NONHDL: 85.27
Triglycerides: 54 mg/dL (ref 0.0–149.0)
VLDL: 10.8 mg/dL (ref 0.0–40.0)

## 2015-07-16 LAB — TSH: TSH: 1.69 u[IU]/mL (ref 0.35–4.50)

## 2015-07-16 LAB — PSA: PSA: 0.86 ng/mL (ref 0.10–4.00)

## 2015-07-23 ENCOUNTER — Encounter: Payer: Self-pay | Admitting: Family Medicine

## 2015-07-23 ENCOUNTER — Ambulatory Visit (INDEPENDENT_AMBULATORY_CARE_PROVIDER_SITE_OTHER): Payer: 59 | Admitting: Family Medicine

## 2015-07-23 VITALS — BP 122/73 | HR 68 | Temp 98.1°F | Ht 73.5 in | Wt 207.0 lb

## 2015-07-23 DIAGNOSIS — L989 Disorder of the skin and subcutaneous tissue, unspecified: Secondary | ICD-10-CM

## 2015-07-23 DIAGNOSIS — Z0000 Encounter for general adult medical examination without abnormal findings: Secondary | ICD-10-CM | POA: Diagnosis not present

## 2015-07-23 MED ORDER — ATORVASTATIN CALCIUM 20 MG PO TABS: ORAL_TABLET | ORAL | Status: DC

## 2015-07-23 MED ORDER — CYCLOBENZAPRINE HCL 10 MG PO TABS: 10.0000 mg | ORAL_TABLET | Freq: Three times a day (TID) | ORAL | Status: DC | PRN

## 2015-07-23 MED ORDER — TRIAMCINOLONE ACETONIDE 0.1 % EX CREA: 1.0000 "application " | TOPICAL_CREAM | Freq: Two times a day (BID) | CUTANEOUS | Status: DC

## 2015-07-23 NOTE — Progress Notes (Signed)
   Subjective:    Patient ID: Sean Marshall, male    DOB: Aug 21, 1966, 49 y.o.   MRN: [id number]  HPI 49 yr old male for a cpx. He has some issues to discuss. First he has had an itchy rash over the chin for several months. Second, he has had a number of skin lesions from sun damage over the years and asks me to cehcl these areas. Third, he has had frequent headaches in the past few months that always start on the right posterior nec. These then radiate up the neck to the right back of the head. No vision changes.    Review of Systems  Constitutional: Negative.   Eyes: Negative.   Respiratory: Negative.   Cardiovascular: Negative.   Gastrointestinal: Negative.   Genitourinary: Negative.   Musculoskeletal: Negative.   Skin: Positive for rash. Negative for color change, pallor and wound.  Neurological: Positive for headaches. Negative for dizziness, tremors, seizures, syncope, facial asymmetry, speech difficulty, weakness, light-headedness and numbness.  Psychiatric/Behavioral: Negative.        Objective:   Physical Exam  Constitutional: He is oriented to person, place, and time. He appears well-developed and well-nourished. No distress.  HENT:  Head: Normocephalic and atraumatic.  Right Ear: External ear normal.  Left Ear: External ear normal.  Nose: Nose normal.  Mouth/Throat: Oropharynx is clear and moist. No oropharyngeal exudate.  Eyes: Conjunctivae and EOM are normal. Pupils are equal, round, and reactive to light. Right eye exhibits no discharge. Left eye exhibits no discharge. No scleral icterus.  Neck: Neck supple. No JVD present. No tracheal deviation present. No thyromegaly present.  Cardiovascular: Normal rate, regular rhythm, normal heart sounds and intact distal pulses.  Exam reveals no gallop and no friction rub.   No murmur heard. Pulmonary/Chest: Effort normal and breath sounds normal. No respiratory distress. He has no wheezes. He has no rales. He exhibits no  tenderness.  Abdominal: Soft. Bowel sounds are normal. He exhibits no distension and no mass. There is no tenderness. There is no rebound and no guarding.  Genitourinary: Rectum normal, prostate normal and penis normal. Guaiac negative stool. No penile tenderness.  Musculoskeletal: Normal range of motion. He exhibits no edema or tenderness.  Lymphadenopathy:    He has no cervical adenopathy.  Neurological: He is alert and oriented to person, place, and time. He has normal reflexes. No cranial nerve deficit. He exhibits normal muscle tone. Coordination normal.  Skin: Skin is warm and dry. He is not diaphoretic. No erythema. No pallor.  Areas of macular erythema and scaling on the chin  Psychiatric: He has a normal mood and affect. His behavior is normal. Judgment and thought content normal.          Assessment & Plan:  Well exam. We discuseed diet and exercise advice. Refer to Dermatology for a skin exam. Use Triamcinolone cream to the chin. He has tension HAs so try heat with Flexeril and Ibuprofen.

## 2015-07-23 NOTE — Progress Notes (Signed)
Pre visit review using our clinic review tool, if applicable. No additional management support is needed unless otherwise documented below in the visit note. 

## 2015-09-08 ENCOUNTER — Other Ambulatory Visit: Payer: Self-pay | Admitting: Family Medicine

## 2015-10-05 ENCOUNTER — Encounter: Payer: Self-pay | Admitting: Family Medicine

## 2016-06-26 ENCOUNTER — Telehealth: Payer: Self-pay | Admitting: Family Medicine

## 2016-06-26 NOTE — Telephone Encounter (Signed)
Pt would like to add PSA to cpx labs.Can I sch?

## 2016-06-27 ENCOUNTER — Other Ambulatory Visit: Payer: Self-pay | Admitting: Family Medicine

## 2016-06-27 DIAGNOSIS — Z0000 Encounter for general adult medical examination without abnormal findings: Secondary | ICD-10-CM

## 2016-06-27 NOTE — Telephone Encounter (Signed)
I put in a future lab order and left a voice message for pt with this information.

## 2016-06-27 NOTE — Telephone Encounter (Signed)
Yes please add a PSA

## 2016-06-30 NOTE — Telephone Encounter (Signed)
psa added

## 2016-07-22 ENCOUNTER — Other Ambulatory Visit (INDEPENDENT_AMBULATORY_CARE_PROVIDER_SITE_OTHER): Payer: BLUE CROSS/BLUE SHIELD

## 2016-07-22 DIAGNOSIS — Z0000 Encounter for general adult medical examination without abnormal findings: Secondary | ICD-10-CM

## 2016-07-22 LAB — CBC WITH DIFFERENTIAL/PLATELET
BASOS ABS: 0 10*3/uL (ref 0.0–0.1)
Basophils Relative: 0.8 % (ref 0.0–3.0)
EOS ABS: 0.1 10*3/uL (ref 0.0–0.7)
Eosinophils Relative: 1.6 % (ref 0.0–5.0)
HCT: 43.9 % (ref 39.0–52.0)
Hemoglobin: 15 g/dL (ref 13.0–17.0)
LYMPHS ABS: 1.2 10*3/uL (ref 0.7–4.0)
LYMPHS PCT: 29.5 % (ref 12.0–46.0)
MCHC: 34.1 g/dL (ref 30.0–36.0)
MCV: 88.3 fl (ref 78.0–100.0)
MONOS PCT: 11.1 % (ref 3.0–12.0)
Monocytes Absolute: 0.4 10*3/uL (ref 0.1–1.0)
NEUTROS PCT: 57 % (ref 43.0–77.0)
Neutro Abs: 2.2 10*3/uL (ref 1.4–7.7)
Platelets: 181 10*3/uL (ref 150.0–400.0)
RBC: 4.98 Mil/uL (ref 4.22–5.81)
RDW: 13.7 % (ref 11.5–15.5)
WBC: 3.9 10*3/uL — ABNORMAL LOW (ref 4.0–10.5)

## 2016-07-22 LAB — POC URINALSYSI DIPSTICK (AUTOMATED)
Bilirubin, UA: NEGATIVE
Blood, UA: NEGATIVE
Glucose, UA: NEGATIVE
KETONES UA: NEGATIVE
LEUKOCYTES UA: NEGATIVE
NITRITE UA: NEGATIVE
PH UA: 7.5
PROTEIN UA: NEGATIVE
Spec Grav, UA: 1.015
UROBILINOGEN UA: 0.2

## 2016-07-22 LAB — LIPID PANEL
CHOLESTEROL: 110 mg/dL (ref 0–200)
HDL: 37.3 mg/dL — ABNORMAL LOW (ref >39.00)
LDL CALC: 64 mg/dL (ref 0–99)
NonHDL: 72.93
TRIGLYCERIDES: 43 mg/dL (ref 0.0–149.0)
Total CHOL/HDL Ratio: 3
VLDL: 8.6 mg/dL (ref 0.0–40.0)

## 2016-07-22 LAB — BASIC METABOLIC PANEL
BUN: 12 mg/dL (ref 6–23)
CALCIUM: 8.7 mg/dL (ref 8.4–10.5)
CO2: 31 mEq/L (ref 19–32)
CREATININE: 1.01 mg/dL (ref 0.40–1.50)
Chloride: 101 mEq/L (ref 96–112)
GFR: 83.38 mL/min (ref >60.00)
GLUCOSE: 100 mg/dL — AB (ref 70–99)
Potassium: 4.7 mEq/L (ref 3.5–5.1)
SODIUM: 138 meq/L (ref 135–145)

## 2016-07-22 LAB — HEPATIC FUNCTION PANEL
ALBUMIN: 4.6 g/dL (ref 3.5–5.2)
ALK PHOS: 51 U/L (ref 39–117)
ALT: 19 U/L (ref 0–53)
AST: 18 U/L (ref 0–37)
BILIRUBIN DIRECT: 0.2 mg/dL (ref 0.0–0.3)
Total Bilirubin: 0.9 mg/dL (ref 0.2–1.2)
Total Protein: 6.8 g/dL (ref 6.0–8.3)

## 2016-07-22 LAB — PSA: PSA: 0.46 ng/mL (ref 0.10–4.00)

## 2016-07-22 LAB — TSH: TSH: 1.51 u[IU]/mL (ref 0.35–4.50)

## 2016-07-29 ENCOUNTER — Encounter: Payer: Self-pay | Admitting: Family Medicine

## 2016-07-29 ENCOUNTER — Ambulatory Visit (INDEPENDENT_AMBULATORY_CARE_PROVIDER_SITE_OTHER): Payer: BLUE CROSS/BLUE SHIELD | Admitting: Family Medicine

## 2016-07-29 VITALS — BP 109/70 | HR 66 | Temp 98.2°F | Ht 73.5 in | Wt 202.0 lb

## 2016-07-29 DIAGNOSIS — Z0000 Encounter for general adult medical examination without abnormal findings: Secondary | ICD-10-CM

## 2016-07-29 MED ORDER — ATORVASTATIN CALCIUM 20 MG PO TABS: ORAL_TABLET | ORAL | 3 refills | Status: DC

## 2016-07-29 NOTE — Progress Notes (Signed)
   Subjective:    Patient ID: Sean Marshall, male    DOB: 08/27/1966, 50 y.o.   MRN: [id number]  HPI 50 yr old male for a well exam. He feels well. He saw Dr. Terri PiedraLupton and had a few skin lesions frozen off.   Review of Systems  Constitutional: Negative.   HENT: Negative.   Eyes: Negative.   Respiratory: Negative.   Cardiovascular: Negative.   Gastrointestinal: Negative.   Genitourinary: Negative.   Musculoskeletal: Negative.   Skin: Negative.   Neurological: Negative.   Psychiatric/Behavioral: Negative.        Objective:   Physical Exam  Constitutional: He is oriented to person, place, and time. He appears well-developed and well-nourished. No distress.  HENT:  Head: Normocephalic and atraumatic.  Right Ear: External ear normal.  Left Ear: External ear normal.  Nose: Nose normal.  Mouth/Throat: Oropharynx is clear and moist. No oropharyngeal exudate.  Eyes: Conjunctivae and EOM are normal. Pupils are equal, round, and reactive to light. Right eye exhibits no discharge. Left eye exhibits no discharge. No scleral icterus.  Neck: Neck supple. No JVD present. No tracheal deviation present. No thyromegaly present.  Cardiovascular: Normal rate, regular rhythm, normal heart sounds and intact distal pulses.  Exam reveals no gallop and no friction rub.   No murmur heard. Pulmonary/Chest: Effort normal and breath sounds normal. No respiratory distress. He has no wheezes. He has no rales. He exhibits no tenderness.  Abdominal: Soft. Bowel sounds are normal. He exhibits no distension and no mass. There is no tenderness. There is no rebound and no guarding.  Genitourinary: Rectum normal, prostate normal and penis normal. Rectal exam shows guaiac negative stool. No penile tenderness.  Musculoskeletal: Normal range of motion. He exhibits no edema or tenderness.  Lymphadenopathy:    He has no cervical adenopathy.  Neurological: He is alert and oriented to person, place, and time. He has normal  reflexes. No cranial nerve deficit. He exhibits normal muscle tone. Coordination normal.  Skin: Skin is warm and dry. No rash noted. He is not diaphoretic. No erythema. No pallor.  Psychiatric: He has a normal mood and affect. His behavior is normal. Judgment and thought content normal.          Assessment & Plan:  Well exam. We discussed diet and exercise.  Nelwyn SalisburyFRY,Azion Centrella A, MD

## 2016-07-29 NOTE — Progress Notes (Signed)
Pre visit review using our clinic review tool, if applicable. No additional management support is needed unless otherwise documented below in the visit note. 

## 2016-07-31 ENCOUNTER — Other Ambulatory Visit: Payer: Self-pay | Admitting: Family Medicine

## 2016-09-02 DIAGNOSIS — D1801 Hemangioma of skin and subcutaneous tissue: Secondary | ICD-10-CM | POA: Diagnosis not present

## 2016-09-02 DIAGNOSIS — L821 Other seborrheic keratosis: Secondary | ICD-10-CM | POA: Diagnosis not present

## 2016-09-02 DIAGNOSIS — L570 Actinic keratosis: Secondary | ICD-10-CM | POA: Diagnosis not present

## 2016-09-02 DIAGNOSIS — L814 Other melanin hyperpigmentation: Secondary | ICD-10-CM | POA: Diagnosis not present

## 2016-09-02 DIAGNOSIS — D225 Melanocytic nevi of trunk: Secondary | ICD-10-CM | POA: Diagnosis not present

## 2017-07-30 ENCOUNTER — Ambulatory Visit (INDEPENDENT_AMBULATORY_CARE_PROVIDER_SITE_OTHER): Payer: BLUE CROSS/BLUE SHIELD | Admitting: Family Medicine

## 2017-07-30 ENCOUNTER — Encounter: Payer: Self-pay | Admitting: Family Medicine

## 2017-07-30 VITALS — BP 105/85 | HR 63 | Temp 98.1°F | Ht 73.5 in | Wt 195.0 lb

## 2017-07-30 DIAGNOSIS — Z125 Encounter for screening for malignant neoplasm of prostate: Secondary | ICD-10-CM | POA: Diagnosis not present

## 2017-07-30 DIAGNOSIS — Z0000 Encounter for general adult medical examination without abnormal findings: Secondary | ICD-10-CM

## 2017-07-30 LAB — CBC WITH DIFFERENTIAL/PLATELET
BASOS PCT: 0.9 % (ref 0.0–3.0)
Basophils Absolute: 0 10*3/uL (ref 0.0–0.1)
EOS ABS: 0 10*3/uL (ref 0.0–0.7)
Eosinophils Relative: 1.2 % (ref 0.0–5.0)
HCT: 45.5 % (ref 39.0–52.0)
Hemoglobin: 15.5 g/dL (ref 13.0–17.0)
Lymphocytes Relative: 27.5 % (ref 12.0–46.0)
Lymphs Abs: 1.2 10*3/uL (ref 0.7–4.0)
MCHC: 34 g/dL (ref 30.0–36.0)
MCV: 90.5 fl (ref 78.0–100.0)
MONO ABS: 0.4 10*3/uL (ref 0.1–1.0)
Monocytes Relative: 10.1 % (ref 3.0–12.0)
NEUTROS ABS: 2.5 10*3/uL (ref 1.4–7.7)
NEUTROS PCT: 60.3 % (ref 43.0–77.0)
PLATELETS: 185 10*3/uL (ref 150.0–400.0)
RBC: 5.03 Mil/uL (ref 4.22–5.81)
RDW: 13.5 % (ref 11.5–15.5)
WBC: 4.2 10*3/uL (ref 4.0–10.5)

## 2017-07-30 LAB — HEPATIC FUNCTION PANEL
ALBUMIN: 4.6 g/dL (ref 3.5–5.2)
ALK PHOS: 54 U/L (ref 39–117)
ALT: 21 U/L (ref 0–53)
AST: 18 U/L (ref 0–37)
BILIRUBIN TOTAL: 1.1 mg/dL (ref 0.2–1.2)
Bilirubin, Direct: 0.2 mg/dL (ref 0.0–0.3)
Total Protein: 6.8 g/dL (ref 6.0–8.3)

## 2017-07-30 LAB — POC URINALSYSI DIPSTICK (AUTOMATED)
BILIRUBIN UA: NEGATIVE
Blood, UA: NEGATIVE
GLUCOSE UA: NEGATIVE
Ketones, UA: NEGATIVE
Leukocytes, UA: NEGATIVE
Nitrite, UA: NEGATIVE
Protein, UA: NEGATIVE
SPEC GRAV UA: 1.025 (ref 1.010–1.025)
UROBILINOGEN UA: 0.2 U/dL
pH, UA: 6 (ref 5.0–8.0)

## 2017-07-30 LAB — BASIC METABOLIC PANEL
BUN: 13 mg/dL (ref 6–23)
CHLORIDE: 102 meq/L (ref 96–112)
CO2: 32 mEq/L (ref 19–32)
Calcium: 9.3 mg/dL (ref 8.4–10.5)
Creatinine, Ser: 1.05 mg/dL (ref 0.40–1.50)
GFR: 79.39 mL/min (ref >60.00)
Glucose, Bld: 95 mg/dL (ref 70–99)
POTASSIUM: 4.6 meq/L (ref 3.5–5.1)
Sodium: 138 mEq/L (ref 135–145)

## 2017-07-30 LAB — LIPID PANEL
CHOL/HDL RATIO: 3
Cholesterol: 115 mg/dL (ref 0–200)
HDL: 37.4 mg/dL — ABNORMAL LOW (ref >39.00)
LDL CALC: 69 mg/dL (ref 0–99)
NONHDL: 77.8
TRIGLYCERIDES: 43 mg/dL (ref 0.0–149.0)
VLDL: 8.6 mg/dL (ref 0.0–40.0)

## 2017-07-30 LAB — TSH: TSH: 1.5 u[IU]/mL (ref 0.35–4.50)

## 2017-07-30 LAB — PSA: PSA: 1.22 ng/mL (ref 0.10–4.00)

## 2017-07-30 MED ORDER — ATORVASTATIN CALCIUM 20 MG PO TABS: ORAL_TABLET | ORAL | 3 refills | Status: DC

## 2017-07-30 NOTE — Progress Notes (Signed)
   Subjective:    Patient ID: Sean Marshall, male    DOB: 08/03/1966, 51 y.o.   MRN: [id number]  HPI Here for a well exam. He feels fine.    Review of Systems  Constitutional: Negative.   HENT: Negative.   Eyes: Negative.   Respiratory: Negative.   Cardiovascular: Negative.   Gastrointestinal: Negative.   Genitourinary: Negative.   Musculoskeletal: Negative.   Skin: Negative.   Neurological: Negative.   Psychiatric/Behavioral: Negative.        Objective:   Physical Exam  Constitutional: He is oriented to person, place, and time. He appears well-developed and well-nourished. No distress.  HENT:  Head: Normocephalic and atraumatic.  Right Ear: External ear normal.  Left Ear: External ear normal.  Nose: Nose normal.  Mouth/Throat: Oropharynx is clear and moist. No oropharyngeal exudate.  Eyes: Pupils are equal, round, and reactive to light. Conjunctivae and EOM are normal. Right eye exhibits no discharge. Left eye exhibits no discharge. No scleral icterus.  Neck: Neck supple. No JVD present. No tracheal deviation present. No thyromegaly present.  Cardiovascular: Normal rate, regular rhythm, normal heart sounds and intact distal pulses.  Exam reveals no gallop and no friction rub.   No murmur heard. Pulmonary/Chest: Effort normal and breath sounds normal. No respiratory distress. He has no wheezes. He has no rales. He exhibits no tenderness.  Abdominal: Soft. Bowel sounds are normal. He exhibits no distension and no mass. There is no tenderness. There is no rebound and no guarding.  Genitourinary: Rectum normal, prostate normal and penis normal. Rectal exam shows guaiac negative stool. No penile tenderness.  Musculoskeletal: Normal range of motion. He exhibits no edema or tenderness.  Lymphadenopathy:    He has no cervical adenopathy.  Neurological: He is alert and oriented to person, place, and time. He has normal reflexes. No cranial nerve deficit. He exhibits normal muscle  tone. Coordination normal.  Skin: Skin is warm and dry. No rash noted. He is not diaphoretic. No erythema. No pallor.  Psychiatric: He has a normal mood and affect. His behavior is normal. Judgment and thought content normal.          Assessment & Plan:  Well exam. We discussed diet and exercise. Get fasting labs. Refer for a colonoscopy.  Gershon CraneStephen Jemar Paulsen, MD

## 2017-07-30 NOTE — Patient Instructions (Signed)
WE NOW OFFER    [facility] FAST TRACK!!!  SAME DAY Appointments for ACUTE CARE  Such as: Sprains, Injuries, cuts, abrasions, rashes, muscle pain, joint pain, back pain Colds, flu, sore throats, headache, allergies, cough, fever  Ear pain, sinus and eye infections Abdominal pain, nausea, vomiting, diarrhea, upset stomach Animal/insect bites  3 Easy Ways to Schedule: Walk-In Scheduling Call in scheduling Mychart Sign-up: [email or web address]         

## 2017-08-20 ENCOUNTER — Encounter: Payer: Self-pay | Admitting: Family Medicine

## 2017-08-24 DIAGNOSIS — D1801 Hemangioma of skin and subcutaneous tissue: Secondary | ICD-10-CM | POA: Diagnosis not present

## 2017-08-24 DIAGNOSIS — L820 Inflamed seborrheic keratosis: Secondary | ICD-10-CM | POA: Diagnosis not present

## 2017-08-24 DIAGNOSIS — L570 Actinic keratosis: Secondary | ICD-10-CM | POA: Diagnosis not present

## 2017-08-31 ENCOUNTER — Encounter: Payer: Self-pay | Admitting: Family Medicine

## 2017-09-16 DIAGNOSIS — H524 Presbyopia: Secondary | ICD-10-CM | POA: Diagnosis not present

## 2017-09-21 ENCOUNTER — Encounter: Payer: Self-pay | Admitting: Gastroenterology

## 2017-10-14 ENCOUNTER — Other Ambulatory Visit: Payer: Self-pay

## 2017-10-14 ENCOUNTER — Ambulatory Visit: Payer: BLUE CROSS/BLUE SHIELD | Admitting: *Deleted

## 2017-10-14 VITALS — Ht 75.0 in | Wt 204.4 lb

## 2017-10-14 DIAGNOSIS — Z1211 Encounter for screening for malignant neoplasm of colon: Secondary | ICD-10-CM

## 2017-10-14 MED ORDER — NA SULFATE-K SULFATE-MG SULF 17.5-3.13-1.6 GM/177ML PO SOLN: ORAL | 0 refills | Status: DC

## 2017-10-14 NOTE — Progress Notes (Signed)
No allergies to eggs or soy. No problems with anesthesia.  Pt given Emmi instructions for colonoscopy  No oxygen use  No diet drug use  

## 2017-10-15 ENCOUNTER — Encounter: Payer: Self-pay | Admitting: Gastroenterology

## 2017-10-21 ENCOUNTER — Encounter: Payer: Self-pay | Admitting: Gastroenterology

## 2017-10-21 ENCOUNTER — Other Ambulatory Visit: Payer: Self-pay

## 2017-10-21 ENCOUNTER — Ambulatory Visit (AMBULATORY_SURGERY_CENTER): Payer: BLUE CROSS/BLUE SHIELD | Admitting: Gastroenterology

## 2017-10-21 VITALS — BP 125/91 | HR 53 | Temp 97.8°F | Resp 14 | Ht 73.0 in | Wt 195.0 lb

## 2017-10-21 DIAGNOSIS — K6289 Other specified diseases of anus and rectum: Secondary | ICD-10-CM

## 2017-10-21 DIAGNOSIS — K599 Functional intestinal disorder, unspecified: Secondary | ICD-10-CM | POA: Diagnosis not present

## 2017-10-21 DIAGNOSIS — Z1212 Encounter for screening for malignant neoplasm of rectum: Secondary | ICD-10-CM | POA: Diagnosis not present

## 2017-10-21 DIAGNOSIS — Z1211 Encounter for screening for malignant neoplasm of colon: Secondary | ICD-10-CM

## 2017-10-21 HISTORY — PX: COLONOSCOPY: SHX174

## 2017-10-21 MED ORDER — SODIUM CHLORIDE 0.9 % IV SOLN: 500.0000 mL | INTRAVENOUS | Status: AC

## 2017-10-21 NOTE — Progress Notes (Signed)
Report given to PACU, vss 

## 2017-10-21 NOTE — Patient Instructions (Signed)
YOU HAD AN ENDOSCOPIC PROCEDURE TODAY AT THE [facility]:   Refer to the procedure report that was given to you for any specific questions about what was found during the examination.  If the procedure report does not answer your questions, please call your gastroenterologist to clarify.  If you requested that your care partner not be given the details of your procedure findings, then the procedure report has been included in a sealed envelope for you to review at your convenience later.  YOU SHOULD EXPECT: Some feelings of bloating in the abdomen. Passage of more gas than usual.  Walking can help get rid of the air that was put into your GI tract during the procedure and reduce the bloating. If you had a lower endoscopy (such as a colonoscopy or flexible sigmoidoscopy) you may notice spotting of blood in your stool or on the toilet paper. If you underwent a bowel prep for your procedure, you may not have a normal bowel movement for a few days.  Please Note:  You might notice some irritation and congestion in your nose or some drainage.  This is from the oxygen used during your procedure.  There is no need for concern and it should clear up in a day or so.  SYMPTOMS TO REPORT IMMEDIATELY:   Following lower endoscopy (colonoscopy or flexible sigmoidoscopy):  Excessive amounts of blood in the stool  Significant tenderness or worsening of abdominal pains  Swelling of the abdomen that is new, acute  Fever of 100F or higher   For urgent or emergent issues, a gastroenterologist can be reached at any hour by calling [phone].   DIET:  We do recommend a small meal at first, but then you may proceed to your regular diet.  Drink plenty of fluids but you should avoid alcoholic beverages for 24 hours.  ACTIVITY:  You should plan to take it easy for the rest of today and you should NOT DRIVE or use heavy machinery until tomorrow (because of the sedation medicines used during the test).     FOLLOW UP: Our staff will call the number listed on your records the next business day following your procedure to check on you and address any questions or concerns that you may have regarding the information given to you following your procedure. If we do not reach you, we will leave a message.  However, if you are feeling well and you are not experiencing any problems, there is no need to return our call.  We will assume that you have returned to your regular daily activities without incident.  If any biopsies were taken you will be contacted by phone or by letter within the next 1-3 weeks.  Please call us at [phone] if you have not heard about the biopsies in 3 weeks.    SIGNATURES/CONFIDENTIALITY: You and/or your care partner have signed paperwork which will be entered into your electronic medical record.  These signatures attest to the fact that that the information above on your After Visit Summary has been reviewed and is understood.  Full responsibility of the confidentiality of this discharge information lies with you and/or your care-partner.   Resume medications.  

## 2017-10-21 NOTE — Op Note (Signed)
[facility] Patient Name: Sean HellingBrian Marshall Procedure Date: 10/21/2017 10:03 AM MRN: [id number] Endoscopist: Rachael Feeaniel P Vonnie Spagnolo , MD Age: [age] Referring MD:  Date of Birth: 07/22/1966 Gender: Male Account #: [id number] Procedure:                Colonoscopy Indications:              Screening for colorectal malignant neoplasm Medicines:                Monitored Anesthesia Care Procedure:                Pre-Anesthesia Assessment:                           - Prior to the procedure, a History and Physical                            was performed, and patient medications and                            allergies were reviewed. The patient's tolerance of                            previous anesthesia was also reviewed. The risks                            and benefits of the procedure and the sedation                            options and risks were discussed with the patient.                            All questions were answered, and informed consent                            was obtained. Prior Anticoagulants: The patient has                            taken no previous anticoagulant or antiplatelet                            agents. ASA Grade Assessment: II - A patient with                            mild systemic disease. After reviewing the risks                            and benefits, the patient was deemed in                            satisfactory condition to undergo the procedure.                           After obtaining informed consent, the colonoscope  was passed under direct vision. Throughout the                            procedure, the patient's blood pressure, pulse, and                            oxygen saturations were monitored continuously. The                            Colonoscope was introduced through the anus and                            advanced to the the cecum, identified by                            appendiceal orifice and  ileocecal valve. The                            colonoscopy was performed without difficulty. The                            patient tolerated the procedure well. The quality                            of the bowel preparation was good. The ileocecal                            valve, appendiceal orifice, and rectum were                            photographed. Scope In: 10:07:40 AM Scope Out: 10:17:03 AM Scope Withdrawal Time: 0 hours 8 minutes 2 seconds  Total Procedure Duration: 0 hours 9 minutes 23 seconds  Findings:                 Mild inflammation characterized by erythema was                            found in the distal rectum (the distal most 203cm                            of his rectum was slightly inflammed appearing,                            colitis vs. prep effect?). Biopsies were taken with                            a cold forceps for histology.                           No polyps or cancers. Complications:            No immediate complications. Estimated blood loss:  None. Estimated Blood Loss:     Estimated blood loss: none. Impression:               - Mild inflammation was found in the distal rectum                            (colitis vs. prep effect?). Biopsied.                           - The examination was otherwise normal on direct                            and retroflexion views. Recommendation:           - Patient has a contact number available for                            emergencies. The signs and symptoms of potential                            delayed complications were discussed with the                            patient. Return to normal activities tomorrow.                            Written discharge instructions were provided to the                            patient.                           - Resume previous diet.                           - Continue present medications.                           - Await rectal  biopsy results.                           - Repeat colonoscopy in 10 years for screening                            purposes. Rachael Fee, MD 10/21/2017 10:25:04 AM This report has been signed electronically.

## 2017-10-21 NOTE — Progress Notes (Signed)
Called to room to assist during endoscopic procedure.  Patient ID and intended procedure confirmed with present staff. Received instructions for my participation in the procedure from the performing physician.  

## 2017-10-26 ENCOUNTER — Telehealth: Payer: Self-pay | Admitting: *Deleted

## 2017-10-26 NOTE — Telephone Encounter (Signed)
Patient returning phone call states he is doing great.

## 2017-10-26 NOTE — Telephone Encounter (Signed)
No answer or message machine. 

## 2017-10-26 NOTE — Telephone Encounter (Signed)
No answer, message left for the patient. 

## 2017-10-30 ENCOUNTER — Encounter: Payer: Self-pay | Admitting: Gastroenterology

## 2017-12-22 ENCOUNTER — Ambulatory Visit: Payer: Self-pay | Admitting: *Deleted

## 2017-12-22 ENCOUNTER — Encounter: Payer: Self-pay | Admitting: Family Medicine

## 2017-12-22 ENCOUNTER — Ambulatory Visit: Payer: BLUE CROSS/BLUE SHIELD | Admitting: Family Medicine

## 2017-12-22 ENCOUNTER — Ambulatory Visit (INDEPENDENT_AMBULATORY_CARE_PROVIDER_SITE_OTHER)
Admission: RE | Admit: 2017-12-22 | Discharge: 2017-12-22 | Disposition: A | Discharge status: Home / Self Care | Payer: BLUE CROSS/BLUE SHIELD | Source: Ambulatory Visit | Attending: Family Medicine | Admitting: Family Medicine

## 2017-12-22 VITALS — BP 110/80 | HR 76 | Temp 98.5°F | Wt 202.0 lb

## 2017-12-22 DIAGNOSIS — R6889 Other general symptoms and signs: Secondary | ICD-10-CM

## 2017-12-22 DIAGNOSIS — B349 Viral infection, unspecified: Secondary | ICD-10-CM

## 2017-12-22 DIAGNOSIS — J181 Lobar pneumonia, unspecified organism: Secondary | ICD-10-CM | POA: Diagnosis not present

## 2017-12-22 LAB — POC INFLUENZA A&B (BINAX/QUICKVUE): Influenza A, POC: NEGATIVE

## 2017-12-22 MED ORDER — ONDANSETRON HCL 8 MG PO TABS: 8.0000 mg | ORAL_TABLET | Freq: Three times a day (TID) | ORAL | 1 refills | Status: DC | PRN

## 2017-12-22 NOTE — Progress Notes (Addendum)
   Subjective:    Patient ID: Sean DonathBrian D Meenach, male    DOB: 01/25/1966, 52 y.o.   MRN: [id number]  HPI Here for 3 days of fever, body aches, a dry cough, stomach cramps, and diarrhea. Taking Tylenol and drinking fluids.   Review of Systems  Constitutional: Positive for fever.  HENT: Positive for congestion. Negative for postnasal drip, sinus pressure, sinus pain and sore throat.   Eyes: Negative.   Respiratory: Positive for cough.   Cardiovascular: Negative.   Gastrointestinal: Positive for abdominal pain, diarrhea and nausea. Negative for abdominal distention, blood in stool, constipation and vomiting.  Genitourinary: Negative.        Objective:   Physical Exam  Constitutional:  Appears ill  HENT:  Right Ear: External ear normal.  Left Ear: External ear normal.  Nose: Nose normal.  Mouth/Throat: Oropharynx is clear and moist.  Eyes: Conjunctivae are normal.  Neck: Neck supple. No thyromegaly present.  Cardiovascular: Normal rate, regular rhythm, normal heart sounds and intact distal pulses.  Pulmonary/Chest: Effort normal. No respiratory distress. He has no wheezes.  There are faint rales in the right posterior base   Abdominal: Soft. Bowel sounds are normal. He exhibits no distension and no mass. There is no rebound and no guarding.  Slight tenderness diffusely   Lymphadenopathy:    He has no cervical adenopathy.          Assessment & Plan:  This is likely a viral illness (his influenza test was negative), but we will send him for a CXR to rule out a pneumonia. Drink fluids. Use Zofran for nausea.  Gershon CraneStephen Reynoldo Mainer, MD  On the morning of 12-23-17 I reviewed his CXR and this showed a RLL pneumonia. We informed the patient by phone and he will start on Augmentin. He will stay out of work until next week. I asked to see him back in one week for follow up. Gershon CraneStephen Jazzmyn Filion, MD

## 2017-12-22 NOTE — Telephone Encounter (Signed)
Pt's wife called regarding husband having nausea and feeling bloated since Saturday morning. Also he is having some body aches. Elevated temp to 101 on Monday.  Unable to drink much and voiding half as much as he normally would. Appointment made for today.  Home care advice given to him with verbal understanding.   Reason for Disposition . Nausea persists > 1 week  Answer Assessment - Initial Assessment Questions 1. NAUSEA SEVERITY: "How bad is the nausea?" (e.g., mild, moderate, severe; dehydration, weight loss)   - MILD: loss of appetite without change in eating habits   - MODERATE: decreased oral intake without significant weight loss, dehydration, or malnutrition   - SEVERE: inadequate caloric or fluid intake, significant weight loss, symptoms of dehydration     severe 2. ONSET: "When did the nausea begin?"     Saturday morning 3. VOMITING: "Any vomiting?" If so, ask: "How many times today?"     No vomiting 4. RECURRENT SYMPTOM: "Have you had nausea before?" If so, ask: "When was the last time?" "What happened that time?"     no 5. CAUSE: "What do you think is causing the nausea?"     Not sure 6. PREGNANCY: "Is there any chance you are pregnant?" (e.g., unprotected intercourse, missed birth control pill, broken condom)     n/a  Protocols used: NAUSEA-A-AH

## 2017-12-22 NOTE — Telephone Encounter (Signed)
Noted  

## 2017-12-23 MED ORDER — AMOXICILLIN-POT CLAVULANATE 875-125 MG PO TABS: 1.0000 | ORAL_TABLET | Freq: Two times a day (BID) | ORAL | 0 refills | Status: DC

## 2017-12-23 NOTE — Addendum Note (Signed)
Addended by: Gershon CraneFRY, Sidonie Dexheimer A on: 12/23/2017 08:19 AM   Modules accepted: Orders

## 2017-12-30 ENCOUNTER — Ambulatory Visit: Payer: BLUE CROSS/BLUE SHIELD | Admitting: Family Medicine

## 2018-01-01 ENCOUNTER — Ambulatory Visit: Payer: BLUE CROSS/BLUE SHIELD | Admitting: Family Medicine

## 2018-01-01 ENCOUNTER — Encounter: Payer: Self-pay | Admitting: Family Medicine

## 2018-01-01 VITALS — BP 104/60 | HR 66 | Temp 98.2°F | Wt 202.4 lb

## 2018-01-01 DIAGNOSIS — J189 Pneumonia, unspecified organism: Secondary | ICD-10-CM

## 2018-01-01 DIAGNOSIS — J181 Lobar pneumonia, unspecified organism: Secondary | ICD-10-CM

## 2018-01-01 NOTE — Progress Notes (Signed)
   Subjective:    Patient ID: Sean DonathBrian D Hamberger, male    DOB: 09/09/1966, 52 y.o.   MRN: [id number]  HPI Here to follow up on a RLL pneumonia. He has been taking Augmentin and he feels better. The cough is gone but he still feels tired. His appetite is returning.    Review of Systems  Constitutional: Positive for fatigue. Negative for chills, diaphoresis and fever.  HENT: Negative.   Eyes: Negative.   Respiratory: Negative.   Cardiovascular: Negative.        Objective:   Physical Exam  Constitutional: He appears well-developed and well-nourished.  HENT:  Right Ear: External ear normal.  Left Ear: External ear normal.  Nose: Nose normal.  Mouth/Throat: Oropharynx is clear and moist.  Eyes: Conjunctivae are normal.  Neck: No thyromegaly present.  Pulmonary/Chest: Effort normal and breath sounds normal. No respiratory distress. He has no wheezes. He has no rales.  Lymphadenopathy:    He has no cervical adenopathy.          Assessment & Plan:  Right lower lobe CAP. He is recovering as expected. He will finish the Augmentin. He has been out of work from 12-22-17 and he will return to work part time (up to 4 hours a day) on 01-04-18. He will return to full time work on 01-11-18. Recheck prn.  Gershon CraneStephen Yuleidy Rappleye, MD

## 2018-07-31 ENCOUNTER — Other Ambulatory Visit: Payer: Self-pay | Admitting: Family Medicine

## 2018-08-17 ENCOUNTER — Encounter: Payer: Self-pay | Admitting: Family Medicine

## 2018-08-17 ENCOUNTER — Ambulatory Visit (INDEPENDENT_AMBULATORY_CARE_PROVIDER_SITE_OTHER): Payer: BLUE CROSS/BLUE SHIELD | Admitting: Family Medicine

## 2018-08-17 ENCOUNTER — Encounter: Payer: Self-pay | Admitting: *Deleted

## 2018-08-17 VITALS — BP 132/84 | HR 66 | Temp 97.8°F | Ht 73.0 in | Wt 206.1 lb

## 2018-08-17 DIAGNOSIS — Z0000 Encounter for general adult medical examination without abnormal findings: Secondary | ICD-10-CM | POA: Diagnosis not present

## 2018-08-17 MED ORDER — ATORVASTATIN CALCIUM 20 MG PO TABS: 20.0000 mg | ORAL_TABLET | Freq: Every day | ORAL | 3 refills | Status: DC

## 2018-08-17 NOTE — Progress Notes (Signed)
   Subjective:    Patient ID: Sean Marshall, male    DOB: 03/18/1966, 52 y.o.   MRN: [id number]  HPI Here for a well exam. He feels great. He recently had a normal eye exam, and he sees Dermatology every year.    Review of Systems  Constitutional: Negative.   HENT: Negative.   Eyes: Negative.   Respiratory: Negative.   Cardiovascular: Negative.   Gastrointestinal: Negative.   Genitourinary: Negative.   Musculoskeletal: Negative.   Skin: Negative.   Neurological: Negative.   Psychiatric/Behavioral: Negative.        Objective:   Physical Exam  Constitutional: He is oriented to person, place, and time. He appears well-developed and well-nourished. No distress.  HENT:  Head: Normocephalic and atraumatic.  Right Ear: External ear normal.  Left Ear: External ear normal.  Nose: Nose normal.  Mouth/Throat: Oropharynx is clear and moist. No oropharyngeal exudate.  Eyes: Pupils are equal, round, and reactive to light. Conjunctivae and EOM are normal. Right eye exhibits no discharge. Left eye exhibits no discharge. No scleral icterus.  Neck: Neck supple. No JVD present. No tracheal deviation present. No thyromegaly present.  Cardiovascular: Normal rate, regular rhythm, normal heart sounds and intact distal pulses. Exam reveals no gallop and no friction rub.  No murmur heard. Pulmonary/Chest: Effort normal and breath sounds normal. No respiratory distress. He has no wheezes. He has no rales. He exhibits no tenderness.  Abdominal: Soft. Bowel sounds are normal. He exhibits no distension and no mass. There is no tenderness. There is no rebound and no guarding.  Genitourinary: Rectum normal, prostate normal and penis normal. Rectal exam shows guaiac negative stool. No penile tenderness.  Musculoskeletal: Normal range of motion. He exhibits no edema or tenderness.  Lymphadenopathy:    He has no cervical adenopathy.  Neurological: He is alert and oriented to person, place, and time. He has  normal reflexes. He displays normal reflexes. No cranial nerve deficit. He exhibits normal muscle tone. Coordination normal.  Skin: Skin is warm and dry. No rash noted. He is not diaphoretic. No erythema. No pallor.  Psychiatric: He has a normal mood and affect. His behavior is normal. Judgment and thought content normal.          Assessment & Plan:  Well exam. We discussed diet and exercise. Set up fasting labs soon.  Gershon CraneStephen Lue [place], MD

## 2018-08-18 LAB — POC URINALSYSI DIPSTICK (AUTOMATED)
BILIRUBIN UA: NEGATIVE
GLUCOSE UA: NEGATIVE
KETONES UA: NEGATIVE
Leukocytes, UA: NEGATIVE
NITRITE UA: NEGATIVE
Protein, UA: NEGATIVE
RBC UA: NEGATIVE
Spec Grav, UA: 1.025 (ref 1.010–1.025)
Urobilinogen, UA: 1 E.U./dL
pH, UA: 6.5 (ref 5.0–8.0)

## 2018-08-18 LAB — CBC WITH DIFFERENTIAL/PLATELET
Basophils Absolute: 0 10*3/uL (ref 0.0–0.1)
Basophils Relative: 1 % (ref 0.0–3.0)
EOS PCT: 1.8 % (ref 0.0–5.0)
Eosinophils Absolute: 0.1 10*3/uL (ref 0.0–0.7)
HCT: 45.1 % (ref 39.0–52.0)
Hemoglobin: 15.8 g/dL (ref 13.0–17.0)
LYMPHS ABS: 1.1 10*3/uL (ref 0.7–4.0)
Lymphocytes Relative: 27.6 % (ref 12.0–46.0)
MCHC: 35.1 g/dL (ref 30.0–36.0)
MCV: 88 fl (ref 78.0–100.0)
MONO ABS: 0.4 10*3/uL (ref 0.1–1.0)
Monocytes Relative: 11.3 % (ref 3.0–12.0)
NEUTROS PCT: 58.3 % (ref 43.0–77.0)
Neutro Abs: 2.3 10*3/uL (ref 1.4–7.7)
PLATELETS: 164 10*3/uL (ref 150.0–400.0)
RBC: 5.12 Mil/uL (ref 4.22–5.81)
RDW: 13.6 % (ref 11.5–15.5)
WBC: 3.9 10*3/uL — AB (ref 4.0–10.5)

## 2018-08-18 LAB — BASIC METABOLIC PANEL
BUN: 14 mg/dL (ref 6–23)
CO2: 31 mEq/L (ref 19–32)
Calcium: 9.1 mg/dL (ref 8.4–10.5)
Chloride: 102 mEq/L (ref 96–112)
Creatinine, Ser: 1.02 mg/dL (ref 0.40–1.50)
GFR: 81.75 mL/min (ref >60.00)
GLUCOSE: 92 mg/dL (ref 70–99)
Potassium: 4.7 mEq/L (ref 3.5–5.1)
SODIUM: 137 meq/L (ref 135–145)

## 2018-08-18 LAB — TSH: TSH: 1.75 u[IU]/mL (ref 0.35–4.50)

## 2018-08-18 LAB — HEPATIC FUNCTION PANEL
ALBUMIN: 4.5 g/dL (ref 3.5–5.2)
ALT: 19 U/L (ref 0–53)
AST: 16 U/L (ref 0–37)
Alkaline Phosphatase: 54 U/L (ref 39–117)
BILIRUBIN DIRECT: 0.2 mg/dL (ref 0.0–0.3)
Total Bilirubin: 1 mg/dL (ref 0.2–1.2)
Total Protein: 6.8 g/dL (ref 6.0–8.3)

## 2018-08-18 LAB — LIPID PANEL
CHOL/HDL RATIO: 3
CHOLESTEROL: 106 mg/dL (ref 0–200)
HDL: 35.6 mg/dL — ABNORMAL LOW (ref >39.00)
LDL CALC: 62 mg/dL (ref 0–99)
NonHDL: 70.33
TRIGLYCERIDES: 44 mg/dL (ref 0.0–149.0)
VLDL: 8.8 mg/dL (ref 0.0–40.0)

## 2018-08-18 LAB — PSA: PSA: 0.88 ng/mL (ref 0.10–4.00)

## 2018-08-18 NOTE — Addendum Note (Signed)
Addended by: Bonnye FavaKWEI, NANA K on: 08/18/2018 09:33 AM   Modules accepted: Orders

## 2018-08-24 ENCOUNTER — Encounter: Payer: Self-pay | Admitting: *Deleted

## 2018-08-24 DIAGNOSIS — L819 Disorder of pigmentation, unspecified: Secondary | ICD-10-CM | POA: Diagnosis not present

## 2018-08-24 DIAGNOSIS — D1801 Hemangioma of skin and subcutaneous tissue: Secondary | ICD-10-CM | POA: Diagnosis not present

## 2018-08-24 DIAGNOSIS — L570 Actinic keratosis: Secondary | ICD-10-CM | POA: Diagnosis not present

## 2018-08-24 DIAGNOSIS — L814 Other melanin hyperpigmentation: Secondary | ICD-10-CM | POA: Diagnosis not present

## 2018-08-24 DIAGNOSIS — L821 Other seborrheic keratosis: Secondary | ICD-10-CM | POA: Diagnosis not present

## 2019-07-08 DIAGNOSIS — Z7189 Other specified counseling: Secondary | ICD-10-CM | POA: Diagnosis not present

## 2019-07-08 DIAGNOSIS — Z20828 Contact with and (suspected) exposure to other viral communicable diseases: Secondary | ICD-10-CM | POA: Diagnosis not present

## 2019-08-22 ENCOUNTER — Other Ambulatory Visit: Payer: Self-pay | Admitting: Family Medicine

## 2019-08-25 DIAGNOSIS — D229 Melanocytic nevi, unspecified: Secondary | ICD-10-CM | POA: Diagnosis not present

## 2019-08-25 DIAGNOSIS — L819 Disorder of pigmentation, unspecified: Secondary | ICD-10-CM | POA: Diagnosis not present

## 2019-08-25 DIAGNOSIS — L570 Actinic keratosis: Secondary | ICD-10-CM | POA: Diagnosis not present

## 2019-08-25 DIAGNOSIS — L821 Other seborrheic keratosis: Secondary | ICD-10-CM | POA: Diagnosis not present

## 2019-08-25 DIAGNOSIS — L814 Other melanin hyperpigmentation: Secondary | ICD-10-CM | POA: Diagnosis not present

## 2019-08-30 ENCOUNTER — Ambulatory Visit (INDEPENDENT_AMBULATORY_CARE_PROVIDER_SITE_OTHER): Payer: BC Managed Care – PPO | Admitting: Family Medicine

## 2019-08-30 ENCOUNTER — Other Ambulatory Visit: Payer: Self-pay

## 2019-08-30 ENCOUNTER — Encounter: Payer: Self-pay | Admitting: Family Medicine

## 2019-08-30 VITALS — BP 120/80 | HR 58 | Temp 97.7°F | Ht 74.0 in | Wt 207.2 lb

## 2019-08-30 DIAGNOSIS — Z23 Encounter for immunization: Secondary | ICD-10-CM | POA: Diagnosis not present

## 2019-08-30 DIAGNOSIS — Z0000 Encounter for general adult medical examination without abnormal findings: Secondary | ICD-10-CM

## 2019-08-30 LAB — BASIC METABOLIC PANEL
BUN: 13 mg/dL (ref 6–23)
CO2: 28 mEq/L (ref 19–32)
Calcium: 9.2 mg/dL (ref 8.4–10.5)
Chloride: 102 mEq/L (ref 96–112)
Creatinine, Ser: 0.88 mg/dL (ref 0.40–1.50)
GFR: 90.83 mL/min (ref >60.00)
Glucose, Bld: 86 mg/dL (ref 70–99)
Potassium: 4.1 mEq/L (ref 3.5–5.1)
Sodium: 138 mEq/L (ref 135–145)

## 2019-08-30 LAB — CBC WITH DIFFERENTIAL/PLATELET
Basophils Absolute: 0 10*3/uL (ref 0.0–0.1)
Basophils Relative: 0.8 % (ref 0.0–3.0)
Eosinophils Absolute: 0 10*3/uL (ref 0.0–0.7)
Eosinophils Relative: 0.9 % (ref 0.0–5.0)
HCT: 44.8 % (ref 39.0–52.0)
Hemoglobin: 15.3 g/dL (ref 13.0–17.0)
Lymphocytes Relative: 22.1 % (ref 12.0–46.0)
Lymphs Abs: 1.1 10*3/uL (ref 0.7–4.0)
MCHC: 34 g/dL (ref 30.0–36.0)
MCV: 90.1 fl (ref 78.0–100.0)
Monocytes Absolute: 0.5 10*3/uL (ref 0.1–1.0)
Monocytes Relative: 10.4 % (ref 3.0–12.0)
Neutro Abs: 3.2 10*3/uL (ref 1.4–7.7)
Neutrophils Relative %: 65.8 % (ref 43.0–77.0)
Platelets: 167 10*3/uL (ref 150.0–400.0)
RBC: 4.98 Mil/uL (ref 4.22–5.81)
RDW: 13.5 % (ref 11.5–15.5)
WBC: 4.9 10*3/uL (ref 4.0–10.5)

## 2019-08-30 LAB — HEPATIC FUNCTION PANEL
ALT: 16 U/L (ref 0–53)
AST: 16 U/L (ref 0–37)
Albumin: 4.7 g/dL (ref 3.5–5.2)
Alkaline Phosphatase: 58 U/L (ref 39–117)
Bilirubin, Direct: 0.2 mg/dL (ref 0.0–0.3)
Total Bilirubin: 0.9 mg/dL (ref 0.2–1.2)
Total Protein: 6.9 g/dL (ref 6.0–8.3)

## 2019-08-30 LAB — LIPID PANEL
Cholesterol: 106 mg/dL (ref 0–200)
HDL: 38.6 mg/dL — ABNORMAL LOW (ref >39.00)
LDL Cholesterol: 58 mg/dL (ref 0–99)
NonHDL: 67.43
Total CHOL/HDL Ratio: 3
Triglycerides: 46 mg/dL (ref 0.0–149.0)
VLDL: 9.2 mg/dL (ref 0.0–40.0)

## 2019-08-30 LAB — PSA: PSA: 1.37 ng/mL (ref 0.10–4.00)

## 2019-08-30 LAB — TSH: TSH: 1.45 u[IU]/mL (ref 0.35–4.50)

## 2019-08-30 MED ORDER — LISINOPRIL 10 MG PO TABS: 10.0000 mg | ORAL_TABLET | Freq: Every day | ORAL | 3 refills | Status: DC

## 2019-08-30 MED ORDER — OMEPRAZOLE 20 MG PO CPDR: 20.0000 mg | DELAYED_RELEASE_CAPSULE | Freq: Every day | ORAL | 3 refills | Status: DC

## 2019-08-30 MED ORDER — TRIAMCINOLONE ACETONIDE 0.1 % EX CREA: 1.0000 "application " | TOPICAL_CREAM | Freq: Two times a day (BID) | CUTANEOUS | 3 refills | Status: AC

## 2019-08-30 NOTE — Progress Notes (Signed)
Subjective:    Patient ID: Sean Marshall, male    DOB: 1966/07/16, 53 y.o.   MRN: [id number]  HPI Here for a well exam and for some other issues. First he thinks he has developed high BP. Both of his parents developed HTN while in their 32s. Over the past month he has often come home from work with a headache, and when he checks his BP it has been consistently in the 130s or 140s over 90s. The highest reading he has had was 160/100. No chest pain or SOB. Also he has had a lot of heartburn for a few months. He has learned to avoid certain things in his diet like spicy or acidic foods, but he still has to take TUMS frequently.    Review of Systems  Constitutional: Negative.   HENT: Negative.   Eyes: Negative.   Respiratory: Negative.   Cardiovascular: Negative.   Gastrointestinal: Negative.   Genitourinary: Negative.   Musculoskeletal: Negative.   Skin: Negative.   Neurological: Positive for headaches.  Psychiatric/Behavioral: Negative.        Objective:   Physical Exam Constitutional:      General: He is not in acute distress.    Appearance: He is well-developed. He is not diaphoretic.  HENT:     Head: Normocephalic and atraumatic.     Right Ear: External ear normal.     Left Ear: External ear normal.     Nose: Nose normal.     Mouth/Throat:     Pharynx: No oropharyngeal exudate.  Eyes:     General: No scleral icterus.       Right eye: No discharge.        Left eye: No discharge.     Conjunctiva/sclera: Conjunctivae normal.     Pupils: Pupils are equal, round, and reactive to light.  Neck:     Musculoskeletal: Neck supple.     Thyroid: No thyromegaly.     Vascular: No JVD.     Trachea: No tracheal deviation.  Cardiovascular:     Rate and Rhythm: Normal rate and regular rhythm.     Heart sounds: Normal heart sounds. No murmur. No friction rub. No gallop.   Pulmonary:     Effort: Pulmonary effort is normal. No respiratory distress.     Breath sounds: Normal breath  sounds. No wheezing or rales.  Chest:     Chest wall: No tenderness.  Abdominal:     General: Bowel sounds are normal. There is no distension.     Palpations: Abdomen is soft. There is no mass.     Tenderness: There is no abdominal tenderness. There is no guarding or rebound.  Genitourinary:    Penis: Normal. No tenderness.      Prostate: Normal.     Rectum: Normal. Guaiac result negative.  Musculoskeletal: Normal range of motion.        General: No tenderness.  Lymphadenopathy:     Cervical: No cervical adenopathy.  Skin:    General: Skin is warm and dry.     Coloration: Skin is not pale.     Findings: No erythema or rash.  Neurological:     Mental Status: He is alert and oriented to person, place, and time.     Cranial Nerves: No cranial nerve deficit.     Motor: No abnormal muscle tone.     Coordination: Coordination normal.     Deep Tendon Reflexes: Reflexes are normal and symmetric. Reflexes normal.  Psychiatric:  Behavior: Behavior normal.        Thought Content: Thought content normal.        Judgment: Judgment normal.           Assessment & Plan:  Well exam. We discussed diet and exercise. Get fasting labs. He now has HTN and we will treat this with Lisinopril 10 mg daily. He will return to follow this up in 4 weeks. Treat the GERD with Omeprazole 20 mg daily.  Alysia Penna, MD

## 2019-09-02 ENCOUNTER — Encounter: Payer: Self-pay | Admitting: Family Medicine

## 2019-09-05 NOTE — Telephone Encounter (Signed)
Called pt and went over labs. Labs will be mailed!

## 2019-09-08 ENCOUNTER — Telehealth: Payer: Self-pay | Admitting: *Deleted

## 2019-09-08 DIAGNOSIS — Z0000 Encounter for general adult medical examination without abnormal findings: Secondary | ICD-10-CM

## 2019-09-08 NOTE — Telephone Encounter (Signed)
Copied from [place] [phone]. Topic: General - Other >> Sep 05, 2019  8:29 AM Mathis Bud wrote: Reason for CRM: Patient called stating his lab results never went into my chart. Please advise.  Patient call back #[phone] >> Sep 08, 2019  8:52 AM Lennox Solders wrote: Pt is calling and never received urine test results on mychart. Pt will come back to do another test if needed

## 2019-09-12 NOTE — Telephone Encounter (Signed)
Okay. Please order a UA and he can come by the lab soon

## 2019-09-12 NOTE — Telephone Encounter (Signed)
Spoke to pt and advise no order was placed. I also advised that I would speak to Dr.Fry to see if one is needed. Pt verbalized understand.

## 2019-09-12 NOTE — Telephone Encounter (Signed)
Please advise if pt needs urine. Order was placed and then canceled.

## 2019-09-14 NOTE — Telephone Encounter (Signed)
This has been taking care of lab order placed!

## 2019-09-15 ENCOUNTER — Other Ambulatory Visit (INDEPENDENT_AMBULATORY_CARE_PROVIDER_SITE_OTHER): Payer: BC Managed Care – PPO

## 2019-09-15 ENCOUNTER — Other Ambulatory Visit: Payer: Self-pay

## 2019-09-15 DIAGNOSIS — Z0000 Encounter for general adult medical examination without abnormal findings: Secondary | ICD-10-CM

## 2019-09-15 LAB — POCT URINALYSIS DIPSTICK
Bilirubin, UA: NEGATIVE
Blood, UA: NEGATIVE
Glucose, UA: NEGATIVE
Ketones, UA: NEGATIVE
Leukocytes, UA: NEGATIVE
Nitrite, UA: NEGATIVE
Odor: NEGATIVE
Protein, UA: NEGATIVE
Spec Grav, UA: 1.015 (ref 1.010–1.025)
Urobilinogen, UA: 0.2 E.U./dL
pH, UA: 7 (ref 5.0–8.0)

## 2019-09-25 ENCOUNTER — Encounter: Payer: Self-pay | Admitting: Family Medicine

## 2019-09-26 NOTE — Telephone Encounter (Signed)
Increase the Lisinopril to 20 mg daily. He can use up the 5's by taking 2 a day. Call in 20 mg #30 with 2 rf. Have him report back in 3-4 weeks

## 2019-10-07 ENCOUNTER — Telehealth: Payer: Self-pay | Admitting: Family Medicine

## 2019-10-07 MED ORDER — LISINOPRIL 20 MG PO TABS: 20.0000 mg | ORAL_TABLET | Freq: Every day | ORAL | 3 refills | Status: DC

## 2019-10-07 NOTE — Telephone Encounter (Signed)
Left message informing pt of update.  

## 2019-10-07 NOTE — Telephone Encounter (Signed)
Patient has called in a few times for medication refill however it has not been sent over to pharmacy. Please advise  Medication is discounted on patients chart.       Medication Refill - Medication: lisinopril (ZESTRIL) 10 MG tablet [[id number]]     Preferred Pharmacy (with phone number or street name):  [id number] [place], [place] - [address]  [address] [place] [address]  Phone: [phone] Fax: [phone]     Agent: Please be advised that RX refills may take up to 3 business days. We ask that you follow-up with your pharmacy.

## 2019-10-07 NOTE — Telephone Encounter (Signed)
Copied from [place] [phone]. Topic: General - Other >> Oct 04, 2019  2:34 PM Carolyn Stare wrote: Pt call to say he rec a call from [facility] and was told that Dr Sarajane Jews was going to increase the below medication from 10mg  to 20mg    pt req a call back when med is sent in   L[facility]INOPRIL  [facility] [place]

## 2019-11-06 ENCOUNTER — Encounter: Payer: Self-pay | Admitting: Family Medicine

## 2019-11-08 MED ORDER — HYDROCHLOROTHIAZIDE 25 MG PO TABS: 25.0000 mg | ORAL_TABLET | Freq: Every day | ORAL | 3 refills | Status: DC

## 2019-11-08 NOTE — Telephone Encounter (Signed)
Let's add HCTZ 25 mg daily. Call in #90 with 3 rf.

## 2019-12-31 ENCOUNTER — Other Ambulatory Visit: Payer: Self-pay | Admitting: Family Medicine

## 2020-01-11 DIAGNOSIS — Z7189 Other specified counseling: Secondary | ICD-10-CM | POA: Diagnosis not present

## 2020-01-11 DIAGNOSIS — Z20828 Contact with and (suspected) exposure to other viral communicable diseases: Secondary | ICD-10-CM | POA: Diagnosis not present

## 2020-01-26 ENCOUNTER — Ambulatory Visit: Payer: Self-pay | Attending: Internal Medicine

## 2020-01-26 DIAGNOSIS — Z23 Encounter for immunization: Secondary | ICD-10-CM | POA: Insufficient documentation

## 2020-01-26 NOTE — Progress Notes (Signed)
   Covid-19 Vaccination Clinic  Name:  Sean Marshall    MRN: [id number] DOB: [birth year]  01/26/2020  [name] was observed post Covid-19 immunization for 15 minutes without incidence. He was provided with Vaccine Information Sheet and instruction to access the V-Safe system.   [name] was instructed to call 911 with any severe reactions post vaccine: Marland Kitchen Difficulty breathing  . Swelling of your face and throat  . A fast heartbeat  . A bad rash all over your body  . Dizziness and weakness    Immunizations Administered    Name Date Dose VIS Date Route   Pfizer COVID-19 Vaccine 01/26/2020  9:08 AM 0.3 mL 11/11/2019 Intramuscular   Manufacturer: [employer], [employer]   Lot: [id number]   NDC: [address]-0

## 2020-02-15 ENCOUNTER — Ambulatory Visit: Payer: Self-pay | Attending: Internal Medicine

## 2020-02-15 DIAGNOSIS — Z23 Encounter for immunization: Secondary | ICD-10-CM

## 2020-02-15 NOTE — Progress Notes (Signed)
   Covid-19 Vaccination Clinic  Name:  Sean Marshall    MRN: [id number] DOB: June 24, 1966  02/15/2020  [name] was observed post Covid-19 immunization for 15 minutes without incident. He was provided with Vaccine Information Sheet and instruction to access the V-Safe system.   [name] was instructed to call 911 with any severe reactions post vaccine: Marland Kitchen Difficulty breathing  . Swelling of face and throat  . A fast heartbeat  . A bad rash all over body  . Dizziness and weakness   Immunizations Administered    Name Date Dose VIS Date Route   Pfizer COVID-19 Vaccine 02/15/2020 11:46 AM 0.3 mL 11/11/2019 Intramuscular   Manufacturer: [employer], [employer]   Lot: [id number]   NDC: [address]-0

## 2020-02-28 DIAGNOSIS — Z03818 Encounter for observation for suspected exposure to other biological agents ruled out: Secondary | ICD-10-CM | POA: Diagnosis not present

## 2020-03-18 DIAGNOSIS — Z03818 Encounter for observation for suspected exposure to other biological agents ruled out: Secondary | ICD-10-CM | POA: Diagnosis not present

## 2020-03-18 DIAGNOSIS — Z20828 Contact with and (suspected) exposure to other viral communicable diseases: Secondary | ICD-10-CM | POA: Diagnosis not present

## 2020-07-15 ENCOUNTER — Other Ambulatory Visit: Payer: Self-pay | Admitting: Family Medicine

## 2020-07-18 DIAGNOSIS — Z03818 Encounter for observation for suspected exposure to other biological agents ruled out: Secondary | ICD-10-CM | POA: Diagnosis not present

## 2020-08-14 ENCOUNTER — Other Ambulatory Visit: Payer: Self-pay | Admitting: Family Medicine

## 2020-08-21 DIAGNOSIS — D229 Melanocytic nevi, unspecified: Secondary | ICD-10-CM | POA: Diagnosis not present

## 2020-08-21 DIAGNOSIS — L821 Other seborrheic keratosis: Secondary | ICD-10-CM | POA: Diagnosis not present

## 2020-08-21 DIAGNOSIS — L570 Actinic keratosis: Secondary | ICD-10-CM | POA: Diagnosis not present

## 2020-08-21 DIAGNOSIS — L578 Other skin changes due to chronic exposure to nonionizing radiation: Secondary | ICD-10-CM | POA: Diagnosis not present

## 2020-08-21 DIAGNOSIS — L814 Other melanin hyperpigmentation: Secondary | ICD-10-CM | POA: Diagnosis not present

## 2020-08-30 ENCOUNTER — Encounter: Payer: Self-pay | Admitting: Family Medicine

## 2020-09-04 DIAGNOSIS — Z03818 Encounter for observation for suspected exposure to other biological agents ruled out: Secondary | ICD-10-CM | POA: Diagnosis not present

## 2020-09-25 ENCOUNTER — Other Ambulatory Visit: Payer: Self-pay

## 2020-09-25 ENCOUNTER — Encounter: Payer: Self-pay | Admitting: Family Medicine

## 2020-09-25 ENCOUNTER — Ambulatory Visit (INDEPENDENT_AMBULATORY_CARE_PROVIDER_SITE_OTHER): Payer: BC Managed Care – PPO | Admitting: Family Medicine

## 2020-09-25 VITALS — BP 110/58 | HR 62 | Temp 98.0°F | Ht 74.5 in | Wt 204.2 lb

## 2020-09-25 DIAGNOSIS — Z0000 Encounter for general adult medical examination without abnormal findings: Secondary | ICD-10-CM | POA: Diagnosis not present

## 2020-09-25 DIAGNOSIS — E785 Hyperlipidemia, unspecified: Secondary | ICD-10-CM | POA: Diagnosis not present

## 2020-09-25 MED ORDER — LISINOPRIL 20 MG PO TABS
20.0000 mg | ORAL_TABLET | Freq: Every day | ORAL | 3 refills | Status: DC
Start: 2020-09-25 — End: 2021-07-15

## 2020-09-25 MED ORDER — CYCLOBENZAPRINE HCL 10 MG PO TABS: 10.0000 mg | ORAL_TABLET | Freq: Three times a day (TID) | ORAL | 2 refills | Status: DC | PRN

## 2020-09-25 NOTE — Progress Notes (Signed)
Subjective:    Patient ID: Sean Marshall, male    DOB: Oct 20, 1966, 54 y.o.   MRN: [id number]  HPI Here for a well exam. He feels fine. We have been following a small right inguinal hernia but he says this never bothers him and it doe snot appear to be growing.   Review of Systems  Constitutional: Negative.   HENT: Negative.   Eyes: Negative.   Respiratory: Negative.   Cardiovascular: Negative.   Gastrointestinal: Negative.   Genitourinary: Negative.   Musculoskeletal: Negative.   Skin: Negative.   Neurological: Negative.   Psychiatric/Behavioral: Negative.        Objective:   Physical Exam Constitutional:      General: He is not in acute distress.    Appearance: He is well-developed. He is not diaphoretic.  HENT:     Head: Normocephalic and atraumatic.     Right Ear: External ear normal.     Left Ear: External ear normal.     Nose: Nose normal.     Mouth/Throat:     Pharynx: No oropharyngeal exudate.  Eyes:     General: No scleral icterus.       Right eye: No discharge.        Left eye: No discharge.     Conjunctiva/sclera: Conjunctivae normal.     Pupils: Pupils are equal, round, and reactive to light.  Neck:     Thyroid: No thyromegaly.     Vascular: No JVD.     Trachea: No tracheal deviation.  Cardiovascular:     Rate and Rhythm: Normal rate and regular rhythm.     Heart sounds: Normal heart sounds. No murmur heard.  No friction rub. No gallop.   Pulmonary:     Effort: Pulmonary effort is normal. No respiratory distress.     Breath sounds: Normal breath sounds. No wheezing or rales.  Chest:     Chest wall: No tenderness.  Abdominal:     General: Bowel sounds are normal. There is no distension.     Palpations: Abdomen is soft. There is no mass.     Tenderness: There is no abdominal tenderness. There is no guarding or rebound.     Comments: Small non-tender easily reducible right direct inguinal hernia   Genitourinary:    Penis: Normal. No tenderness.        Testes: Normal.     Prostate: Normal.     Rectum: Normal. Guaiac result negative.  Musculoskeletal:        General: No tenderness. Normal range of motion.     Cervical back: Neck supple.  Lymphadenopathy:     Cervical: No cervical adenopathy.  Skin:    General: Skin is warm and dry.     Coloration: Skin is not pale.     Findings: No erythema or rash.  Neurological:     Mental Status: He is alert and oriented to person, place, and time.     Cranial Nerves: No cranial nerve deficit.     Motor: No abnormal muscle tone.     Coordination: Coordination normal.     Deep Tendon Reflexes: Reflexes are normal and symmetric. Reflexes normal.  Psychiatric:        Behavior: Behavior normal.        Thought Content: Thought content normal.        Judgment: Judgment normal.           Assessment & Plan:  Well exam. We discussed diet and exercise. Get  fasting labs.  Alysia Penna, MD

## 2020-09-26 DIAGNOSIS — Z03818 Encounter for observation for suspected exposure to other biological agents ruled out: Secondary | ICD-10-CM | POA: Diagnosis not present

## 2020-09-26 LAB — BASIC METABOLIC PANEL
BUN: 14 mg/dL (ref 7–25)
CO2: 31 mmol/L (ref 20–32)
Calcium: 9.4 mg/dL (ref 8.6–10.3)
Chloride: 99 mmol/L (ref 98–110)
Creat: 1.13 mg/dL (ref 0.70–1.33)
Glucose, Bld: 99 mg/dL (ref 65–99)
Potassium: 4.3 mmol/L (ref 3.5–5.3)
Sodium: 139 mmol/L (ref 135–146)

## 2020-09-26 LAB — HEPATIC FUNCTION PANEL
AG Ratio: 1.9 (calc) (ref 1.0–2.5)
ALT: 18 U/L (ref 9–46)
AST: 16 U/L (ref 10–35)
Albumin: 4.7 g/dL (ref 3.6–5.1)
Alkaline phosphatase (APISO): 56 U/L (ref 35–144)
Bilirubin, Direct: 0.2 mg/dL (ref 0.0–0.2)
Globulin: 2.5 g/dL (calc) (ref 1.9–3.7)
Indirect Bilirubin: 0.5 mg/dL (calc) (ref 0.2–1.2)
Total Bilirubin: 0.7 mg/dL (ref 0.2–1.2)
Total Protein: 7.2 g/dL (ref 6.1–8.1)

## 2020-09-26 LAB — CBC WITH DIFFERENTIAL/PLATELET
Absolute Monocytes: 525 cells/uL (ref 200–950)
Basophils Absolute: 29 cells/uL (ref 0–200)
Basophils Relative: 0.7 %
Eosinophils Absolute: 59 cells/uL (ref 15–500)
Eosinophils Relative: 1.4 %
HCT: 45.9 % (ref 38.5–50.0)
Hemoglobin: 15.3 g/dL (ref 13.2–17.1)
Lymphs Abs: 1201 cells/uL (ref 850–3900)
MCH: 30.1 pg (ref 27.0–33.0)
MCHC: 33.3 g/dL (ref 32.0–36.0)
MCV: 90.4 fL (ref 80.0–100.0)
MPV: 10 fL (ref 7.5–12.5)
Monocytes Relative: 12.5 %
Neutro Abs: 2386 cells/uL (ref 1500–7800)
Neutrophils Relative %: 56.8 %
Platelets: 190 10*3/uL (ref 140–400)
RBC: 5.08 10*6/uL (ref 4.20–5.80)
RDW: 12.7 % (ref 11.0–15.0)
Total Lymphocyte: 28.6 %
WBC: 4.2 10*3/uL (ref 3.8–10.8)

## 2020-09-26 LAB — LIPID PANEL
Cholesterol: 126 mg/dL (ref <200)
HDL: 42 mg/dL (ref >=40)
LDL Cholesterol (Calc): 70 mg/dL (calc)
Non-HDL Cholesterol (Calc): 84 mg/dL (calc) (ref <130)
Total CHOL/HDL Ratio: 3 (calc) (ref <5.0)
Triglycerides: 49 mg/dL (ref <150)

## 2020-09-26 LAB — TSH: TSH: 1.43 mIU/L (ref 0.40–4.50)

## 2020-09-26 LAB — PSA: PSA: 0.5 ng/mL (ref <=4.0)

## 2020-10-22 ENCOUNTER — Other Ambulatory Visit: Payer: Self-pay | Admitting: Family Medicine

## 2020-12-03 DIAGNOSIS — Z03818 Encounter for observation for suspected exposure to other biological agents ruled out: Secondary | ICD-10-CM | POA: Diagnosis not present

## 2020-12-05 ENCOUNTER — Emergency Department (HOSPITAL_COMMUNITY)
Admission: EM | Admit: 2020-12-05 | Discharge: 2020-12-05 | Disposition: A | Discharge status: Home / Self Care | Payer: BC Managed Care – PPO | Attending: Emergency Medicine | Admitting: Emergency Medicine

## 2020-12-05 ENCOUNTER — Emergency Department (HOSPITAL_COMMUNITY): Payer: BC Managed Care – PPO

## 2020-12-05 ENCOUNTER — Other Ambulatory Visit: Payer: Self-pay

## 2020-12-05 ENCOUNTER — Encounter (HOSPITAL_COMMUNITY): Payer: Self-pay | Admitting: *Deleted

## 2020-12-05 ENCOUNTER — Encounter: Payer: Self-pay | Admitting: Family Medicine

## 2020-12-05 DIAGNOSIS — Z5321 Procedure and treatment not carried out due to patient leaving prior to being seen by health care provider: Secondary | ICD-10-CM | POA: Insufficient documentation

## 2020-12-05 DIAGNOSIS — R0902 Hypoxemia: Secondary | ICD-10-CM | POA: Diagnosis not present

## 2020-12-05 DIAGNOSIS — R079 Chest pain, unspecified: Secondary | ICD-10-CM | POA: Diagnosis not present

## 2020-12-05 DIAGNOSIS — R0789 Other chest pain: Secondary | ICD-10-CM | POA: Diagnosis not present

## 2020-12-05 DIAGNOSIS — I959 Hypotension, unspecified: Secondary | ICD-10-CM | POA: Diagnosis not present

## 2020-12-05 HISTORY — DX: Essential (primary) hypertension: I10

## 2020-12-05 LAB — TROPONIN I (HIGH SENSITIVITY)
Troponin I (High Sensitivity): 3 ng/L (ref <18)
Troponin I (High Sensitivity): <2 ng/L (ref <18)

## 2020-12-05 LAB — CBC
HCT: 40.9 % (ref 39.0–52.0)
Hemoglobin: 14.2 g/dL (ref 13.0–17.0)
MCH: 31 pg (ref 26.0–34.0)
MCHC: 34.7 g/dL (ref 30.0–36.0)
MCV: 89.3 fL (ref 80.0–100.0)
Platelets: 161 10*3/uL (ref 150–400)
RBC: 4.58 MIL/uL (ref 4.22–5.81)
RDW: 12.8 % (ref 11.5–15.5)
WBC: 7.8 10*3/uL (ref 4.0–10.5)
nRBC: 0 % (ref 0.0–0.2)

## 2020-12-05 LAB — BASIC METABOLIC PANEL
Anion gap: 9 (ref 5–15)
BUN: 18 mg/dL (ref 6–20)
CO2: 27 mmol/L (ref 22–32)
Calcium: 8.4 mg/dL — ABNORMAL LOW (ref 8.9–10.3)
Chloride: 100 mmol/L (ref 98–111)
Creatinine, Ser: 1.09 mg/dL (ref 0.61–1.24)
GFR, Estimated: >60 mL/min (ref >60)
Glucose, Bld: 143 mg/dL — ABNORMAL HIGH (ref 70–99)
Potassium: 3.5 mmol/L (ref 3.5–5.1)
Sodium: 136 mmol/L (ref 135–145)

## 2020-12-05 NOTE — ED Triage Notes (Signed)
Pt arrives via [facility] from home. Called out for CP started about 2330, awoke him from sleep. Pressure/discomfort. No cardiac hx. No sob. 12 lead unremarkable. BP 124, then in the 90's. IV established in the left hand.  fluid given 121/78. 324 asa.

## 2020-12-05 NOTE — ED Notes (Signed)
Pt left AMA. Pt said he will follow up with primary doctor

## 2020-12-05 NOTE — ED Triage Notes (Signed)
Pt reports pain in the upper back and pain in the chest. Woke him up from his sleep around 2330. Denies that he has had this type of pain prior. No shortness of breath, cough, fevers.

## 2020-12-07 NOTE — Telephone Encounter (Signed)
I reviewed the labs and CXR that were done. Everything was normal. Nothing indicated a heart problem. He can see me as needed, but I will leave that up to him

## 2020-12-13 DIAGNOSIS — L578 Other skin changes due to chronic exposure to nonionizing radiation: Secondary | ICD-10-CM | POA: Diagnosis not present

## 2021-02-12 DIAGNOSIS — Z03818 Encounter for observation for suspected exposure to other biological agents ruled out: Secondary | ICD-10-CM | POA: Diagnosis not present

## 2021-02-24 ENCOUNTER — Encounter: Payer: Self-pay | Admitting: Family Medicine

## 2021-06-20 DIAGNOSIS — Z23 Encounter for immunization: Secondary | ICD-10-CM | POA: Diagnosis not present

## 2021-06-23 IMAGING — CR DG CHEST 2V
2 series · 2 of 2 positions shown · non-contrast
Comparison: 12/22/2017

CLINICAL DATA: Chest pain

EXAM:
CHEST - 2 VIEW

[chest pa]
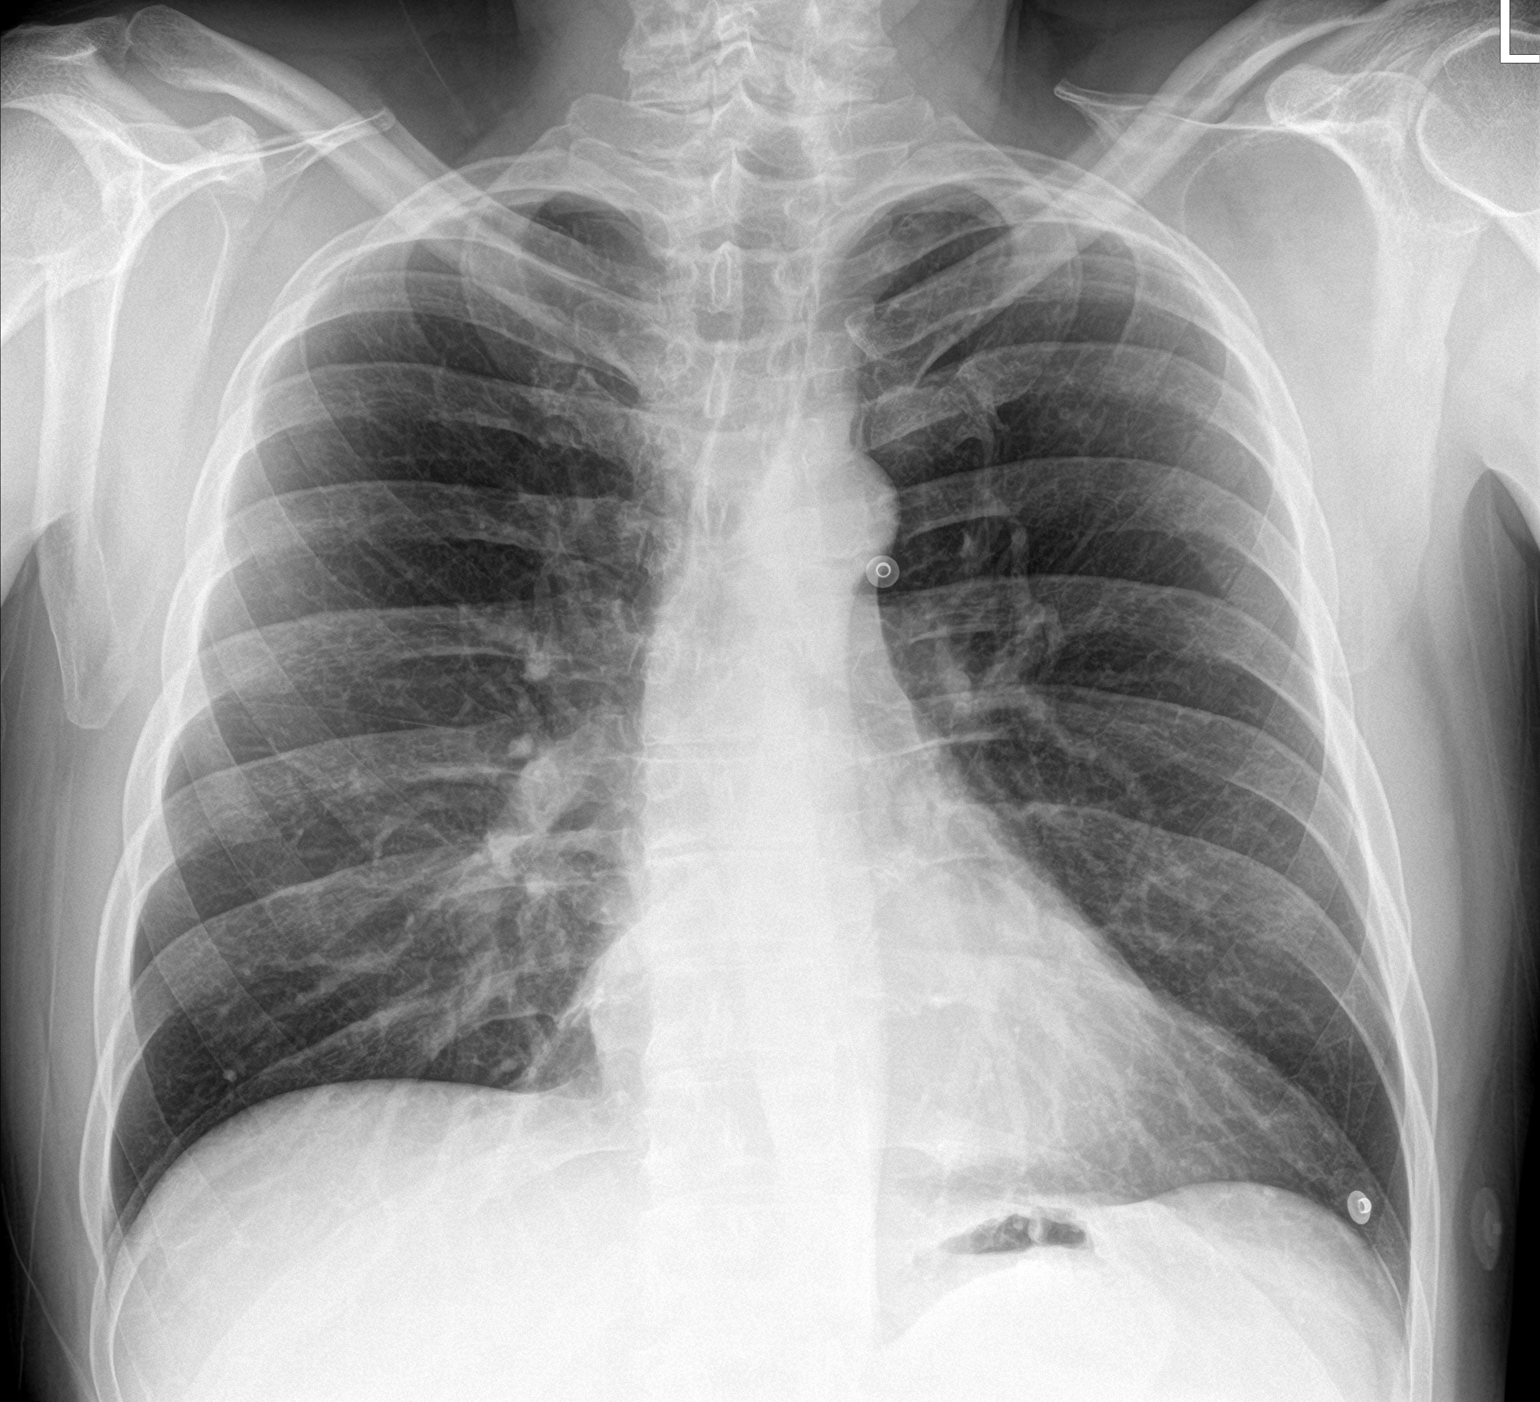

[chest lat]
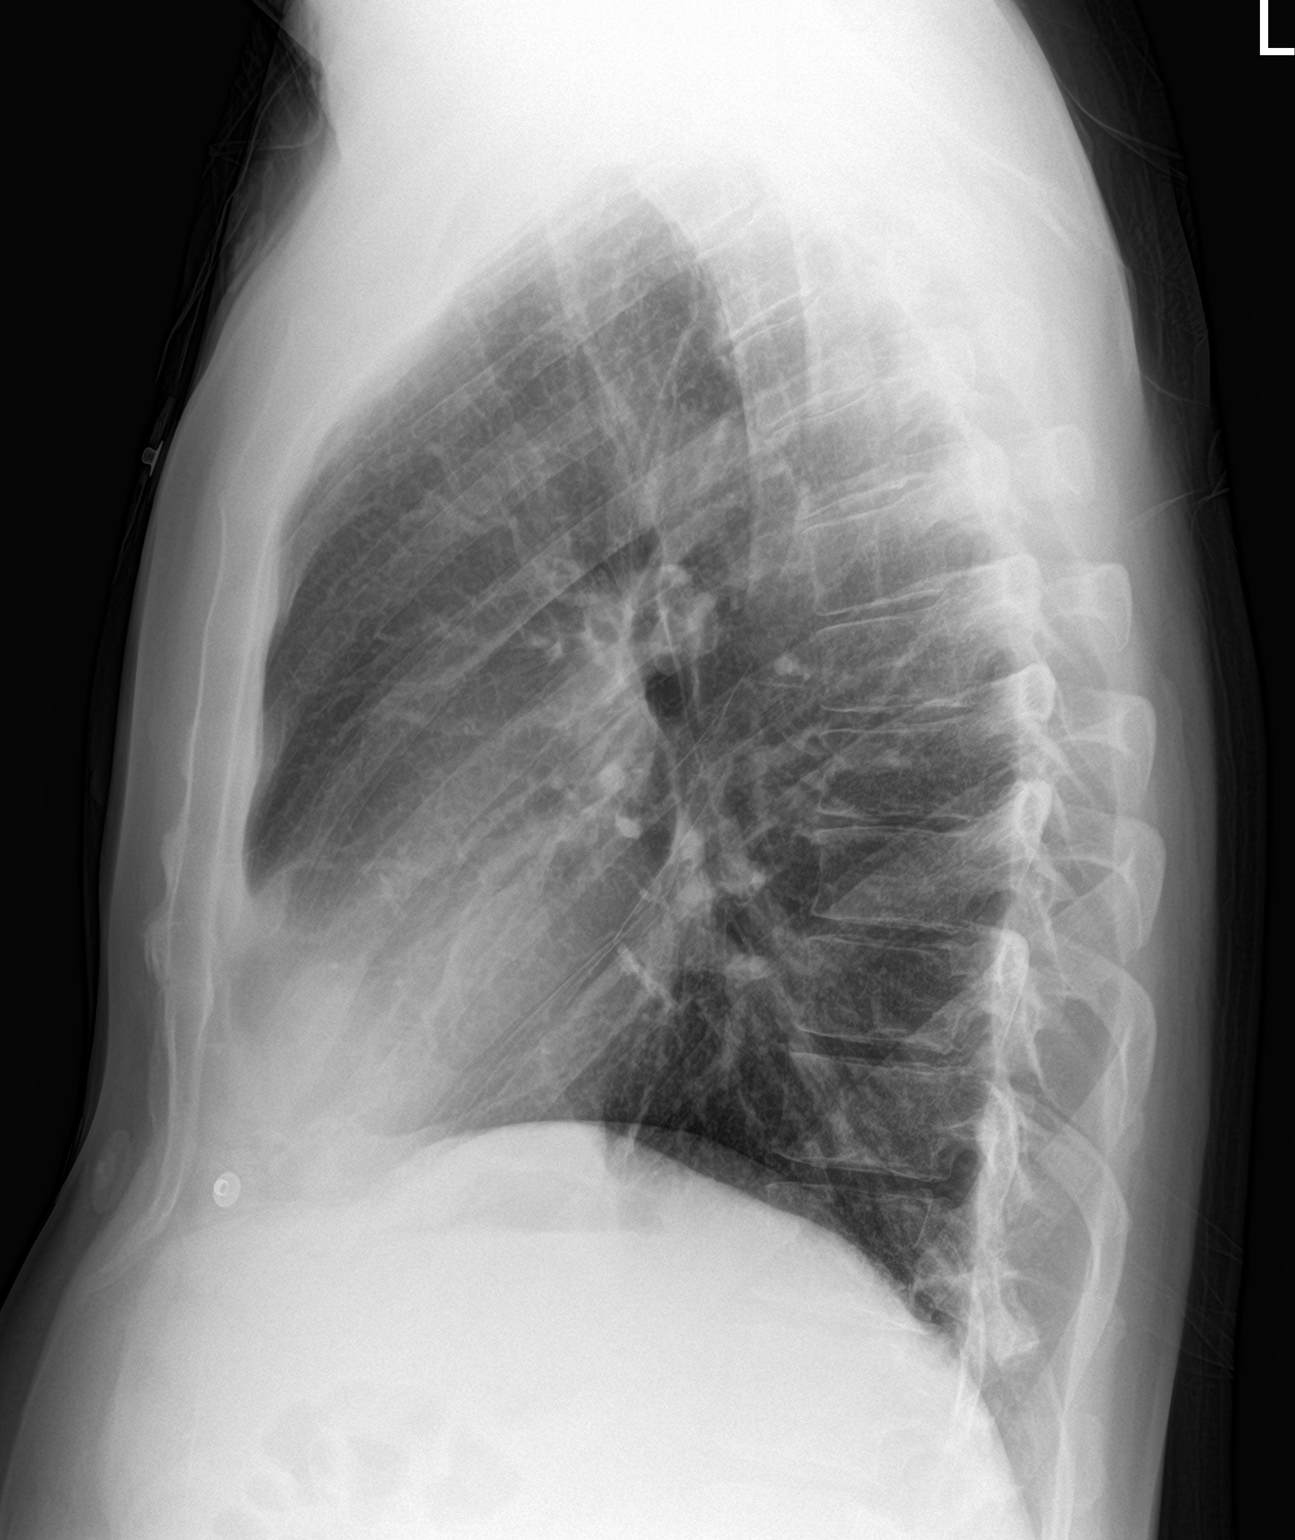

[2 of 2 positions shown; findings below may reference images not displayed]

FINDINGS: The heart size and mediastinal contours are within normal limits.
Both lungs are clear. The visualized skeletal structures are
unremarkable.
IMPRESSION: No active cardiopulmonary disease.

## 2021-07-15 ENCOUNTER — Other Ambulatory Visit: Payer: Self-pay | Admitting: Family Medicine

## 2021-07-16 ENCOUNTER — Telehealth: Payer: Self-pay

## 2021-07-16 NOTE — Telephone Encounter (Signed)
Patient called requesting Rx refill lisinopril (ZESTRIL) 20 MG tablet pt stats he will run out 8/19 and would like refill sent to [facility]

## 2021-07-16 NOTE — Telephone Encounter (Addendum)
Called patient to inform refill for Lisinopril 20mg  was sent to pharmacy on 07/15/21.   Patient said he only have enough tablets until 07/19/21  [facility] [place] with [place].  Patient last refilled prescription for Lisinopril 20mg  on 07/11/21 for 90 tabs. Reverified with 2x's to be sure was correct.       TO SOON  for refill.

## 2021-07-22 MED ORDER — LISINOPRIL 20 MG PO TABS: 20.0000 mg | ORAL_TABLET | Freq: Every day | ORAL | 3 refills | Status: DC

## 2021-07-22 NOTE — Telephone Encounter (Signed)
Done

## 2021-07-22 NOTE — Addendum Note (Signed)
Addended by: Gershon Crane A on: 07/22/2021 07:58 AM   Modules accepted: Orders

## 2021-07-22 NOTE — Telephone Encounter (Signed)
Pt was notified through MyChart 

## 2021-08-20 DIAGNOSIS — Z23 Encounter for immunization: Secondary | ICD-10-CM | POA: Diagnosis not present

## 2021-08-21 ENCOUNTER — Other Ambulatory Visit: Payer: Self-pay | Admitting: Family Medicine

## 2021-08-22 DIAGNOSIS — B079 Viral wart, unspecified: Secondary | ICD-10-CM | POA: Diagnosis not present

## 2021-08-22 DIAGNOSIS — L814 Other melanin hyperpigmentation: Secondary | ICD-10-CM | POA: Diagnosis not present

## 2021-08-22 DIAGNOSIS — L570 Actinic keratosis: Secondary | ICD-10-CM | POA: Diagnosis not present

## 2021-08-22 DIAGNOSIS — D492 Neoplasm of unspecified behavior of bone, soft tissue, and skin: Secondary | ICD-10-CM | POA: Diagnosis not present

## 2021-08-22 DIAGNOSIS — D225 Melanocytic nevi of trunk: Secondary | ICD-10-CM | POA: Diagnosis not present

## 2021-08-22 DIAGNOSIS — L819 Disorder of pigmentation, unspecified: Secondary | ICD-10-CM | POA: Diagnosis not present

## 2021-08-22 DIAGNOSIS — L821 Other seborrheic keratosis: Secondary | ICD-10-CM | POA: Diagnosis not present

## 2021-09-26 ENCOUNTER — Other Ambulatory Visit: Payer: Self-pay

## 2021-09-26 ENCOUNTER — Encounter: Payer: Self-pay | Admitting: Family Medicine

## 2021-09-26 ENCOUNTER — Ambulatory Visit (INDEPENDENT_AMBULATORY_CARE_PROVIDER_SITE_OTHER): Payer: BC Managed Care – PPO | Admitting: Family Medicine

## 2021-09-26 VITALS — BP 110/78 | HR 59 | Temp 98.1°F | Ht 74.0 in | Wt 211.0 lb

## 2021-09-26 DIAGNOSIS — Z0000 Encounter for general adult medical examination without abnormal findings: Secondary | ICD-10-CM

## 2021-09-26 DIAGNOSIS — K219 Gastro-esophageal reflux disease without esophagitis: Secondary | ICD-10-CM | POA: Diagnosis not present

## 2021-09-26 DIAGNOSIS — Z23 Encounter for immunization: Secondary | ICD-10-CM

## 2021-09-26 DIAGNOSIS — I10 Essential (primary) hypertension: Secondary | ICD-10-CM | POA: Diagnosis not present

## 2021-09-26 DIAGNOSIS — Z125 Encounter for screening for malignant neoplasm of prostate: Secondary | ICD-10-CM

## 2021-09-26 DIAGNOSIS — K4090 Unilateral inguinal hernia, without obstruction or gangrene, not specified as recurrent: Secondary | ICD-10-CM

## 2021-09-26 LAB — CBC WITH DIFFERENTIAL/PLATELET
Basophils Absolute: 0 10*3/uL (ref 0.0–0.1)
Basophils Relative: 1 % (ref 0.0–3.0)
Eosinophils Absolute: 0.1 10*3/uL (ref 0.0–0.7)
Eosinophils Relative: 1.6 % (ref 0.0–5.0)
HCT: 44.3 % (ref 39.0–52.0)
Hemoglobin: 15.2 g/dL (ref 13.0–17.0)
Lymphocytes Relative: 29.3 % (ref 12.0–46.0)
Lymphs Abs: 1.2 10*3/uL (ref 0.7–4.0)
MCHC: 34.4 g/dL (ref 30.0–36.0)
MCV: 88.1 fl (ref 78.0–100.0)
Monocytes Absolute: 0.5 10*3/uL (ref 0.1–1.0)
Monocytes Relative: 12.1 % — ABNORMAL HIGH (ref 3.0–12.0)
Neutro Abs: 2.4 10*3/uL (ref 1.4–7.7)
Neutrophils Relative %: 56 % (ref 43.0–77.0)
Platelets: 185 10*3/uL (ref 150.0–400.0)
RBC: 5.03 Mil/uL (ref 4.22–5.81)
RDW: 13.3 % (ref 11.5–15.5)
WBC: 4.3 10*3/uL (ref 4.0–10.5)

## 2021-09-26 LAB — PSA: PSA: 0.89 ng/mL (ref 0.10–4.00)

## 2021-09-26 LAB — HEPATIC FUNCTION PANEL
ALT: 20 U/L (ref 0–53)
AST: 19 U/L (ref 0–37)
Albumin: 4.8 g/dL (ref 3.5–5.2)
Alkaline Phosphatase: 61 U/L (ref 39–117)
Bilirubin, Direct: 0.1 mg/dL (ref 0.0–0.3)
Total Bilirubin: 0.6 mg/dL (ref 0.2–1.2)
Total Protein: 7.4 g/dL (ref 6.0–8.3)

## 2021-09-26 LAB — LIPID PANEL
Cholesterol: 127 mg/dL (ref 0–200)
HDL: 37.5 mg/dL — ABNORMAL LOW (ref >39.00)
LDL Cholesterol: 80 mg/dL (ref 0–99)
NonHDL: 89.72
Total CHOL/HDL Ratio: 3
Triglycerides: 51 mg/dL (ref 0.0–149.0)
VLDL: 10.2 mg/dL (ref 0.0–40.0)

## 2021-09-26 LAB — HEMOGLOBIN A1C: Hgb A1c MFr Bld: 5.9 % (ref 4.6–6.5)

## 2021-09-26 LAB — BASIC METABOLIC PANEL
BUN: 15 mg/dL (ref 6–23)
CO2: 32 mEq/L (ref 19–32)
Calcium: 9.2 mg/dL (ref 8.4–10.5)
Chloride: 99 mEq/L (ref 96–112)
Creatinine, Ser: 1.13 mg/dL (ref 0.40–1.50)
GFR: 73.75 mL/min (ref >60.00)
Glucose, Bld: 104 mg/dL — ABNORMAL HIGH (ref 70–99)
Potassium: 4.1 mEq/L (ref 3.5–5.1)
Sodium: 138 mEq/L (ref 135–145)

## 2021-09-26 LAB — TSH: TSH: 1.3 u[IU]/mL (ref 0.35–5.50)

## 2021-09-26 MED ORDER — CYCLOBENZAPRINE HCL 10 MG PO TABS: 10.0000 mg | ORAL_TABLET | Freq: Three times a day (TID) | ORAL | 2 refills | Status: AC | PRN

## 2021-09-26 MED ORDER — HYDROCHLOROTHIAZIDE 25 MG PO TABS: 25.0000 mg | ORAL_TABLET | Freq: Every day | ORAL | 3 refills | Status: DC

## 2021-09-26 MED ORDER — OMEPRAZOLE 20 MG PO CPDR: DELAYED_RELEASE_CAPSULE | ORAL | 3 refills | Status: DC

## 2021-09-26 MED ORDER — ATORVASTATIN CALCIUM 20 MG PO TABS: 20.0000 mg | ORAL_TABLET | Freq: Every day | ORAL | 3 refills | Status: DC

## 2021-09-26 NOTE — Addendum Note (Signed)
Addended by: Marian Sorrow D on: 09/26/2021 08:46 AM   Modules accepted: Orders

## 2021-09-26 NOTE — Addendum Note (Signed)
Addended by: Carola Rhine on: 09/26/2021 08:45 AM   Modules accepted: Orders

## 2021-09-26 NOTE — Progress Notes (Signed)
Subjective:    Patient ID: Sean Marshall, male    DOB: January 08, 1966, 55 y.o.   MRN: [id number]  HPI Here for a well exam. He feels well. On exam we noted that the right inguinal hernia has gotten larger over the past year. He says it does not bother him.    Review of Systems  Constitutional: Negative.   HENT: Negative.    Eyes: Negative.   Respiratory: Negative.    Cardiovascular: Negative.   Gastrointestinal: Negative.   Genitourinary: Negative.   Musculoskeletal: Negative.   Skin: Negative.   Neurological: Negative.   Psychiatric/Behavioral: Negative.        Objective:   Physical Exam Constitutional:      General: He is not in acute distress.    Appearance: Normal appearance. He is well-developed. He is not diaphoretic.  HENT:     Head: Normocephalic and atraumatic.     Right Ear: External ear normal.     Left Ear: External ear normal.     Nose: Nose normal.     Mouth/Throat:     Pharynx: No oropharyngeal exudate.  Eyes:     General: No scleral icterus.       Right eye: No discharge.        Left eye: No discharge.     Conjunctiva/sclera: Conjunctivae normal.     Pupils: Pupils are equal, round, and reactive to light.  Neck:     Thyroid: No thyromegaly.     Vascular: No JVD.     Trachea: No tracheal deviation.  Cardiovascular:     Rate and Rhythm: Normal rate and regular rhythm.     Heart sounds: Normal heart sounds. No murmur heard.   No friction rub. No gallop.  Pulmonary:     Effort: Pulmonary effort is normal. No respiratory distress.     Breath sounds: Normal breath sounds. No wheezing or rales.  Chest:     Chest wall: No tenderness.  Abdominal:     General: Bowel sounds are normal. There is no distension.     Palpations: Abdomen is soft. There is no mass.     Tenderness: There is no abdominal tenderness. There is no guarding or rebound.     Comments: There is a large reducible non-tender right direct inguinal hernia   Genitourinary:    Penis: Normal.  No tenderness.      Testes: Normal.     Prostate: Normal.     Rectum: Normal. Guaiac result negative.  Musculoskeletal:        General: No tenderness. Normal range of motion.     Cervical back: Neck supple.  Lymphadenopathy:     Cervical: No cervical adenopathy.  Skin:    General: Skin is warm and dry.     Coloration: Skin is not pale.     Findings: No erythema or rash.  Neurological:     Mental Status: He is alert and oriented to person, place, and time.     Cranial Nerves: No cranial nerve deficit.     Motor: No abnormal muscle tone.     Coordination: Coordination normal.     Deep Tendon Reflexes: Reflexes are normal and symmetric. Reflexes normal.  Psychiatric:        Behavior: Behavior normal.        Thought Content: Thought content normal.        Judgment: Judgment normal.          Assessment & Plan:  Well exam. We discussed  diet and exercise. Get fasting labs. We will refer him to Surgery to evaluate the hernia.  Gershon Crane, MD

## 2021-10-30 DIAGNOSIS — K4020 Bilateral inguinal hernia, without obstruction or gangrene, not specified as recurrent: Secondary | ICD-10-CM | POA: Diagnosis not present

## 2021-11-28 DIAGNOSIS — Z23 Encounter for immunization: Secondary | ICD-10-CM | POA: Diagnosis not present

## 2021-12-26 ENCOUNTER — Other Ambulatory Visit: Payer: Self-pay | Admitting: Family Medicine

## 2021-12-27 ENCOUNTER — Other Ambulatory Visit: Payer: Self-pay

## 2021-12-27 ENCOUNTER — Telehealth: Payer: Self-pay | Admitting: Family Medicine

## 2021-12-27 DIAGNOSIS — I10 Essential (primary) hypertension: Secondary | ICD-10-CM

## 2021-12-27 MED ORDER — LISINOPRIL 20 MG PO TABS: 20.0000 mg | ORAL_TABLET | Freq: Every day | ORAL | 1 refills | Status: DC

## 2021-12-27 NOTE — Telephone Encounter (Signed)
Patient called in stating that he received a message from his pharmacy stating that Dr.Fry needs to send in more refills for lisinopril (ZESTRIL) 20 MG tablet [id number]  to the pharmacy.  Patient could be contacted at [phone].  Please advise.

## 2021-12-27 NOTE — Telephone Encounter (Signed)
Spoke with patient.   Refills for lisinopril has been sent to requested pharmacy.

## 2022-01-09 ENCOUNTER — Other Ambulatory Visit: Payer: Self-pay | Admitting: Family Medicine

## 2022-01-09 DIAGNOSIS — I10 Essential (primary) hypertension: Secondary | ICD-10-CM

## 2022-01-10 ENCOUNTER — Other Ambulatory Visit: Payer: Self-pay

## 2022-01-10 DIAGNOSIS — I10 Essential (primary) hypertension: Secondary | ICD-10-CM

## 2022-01-10 MED ORDER — LISINOPRIL 20 MG PO TABS: 20.0000 mg | ORAL_TABLET | Freq: Every day | ORAL | 1 refills | Status: DC

## 2022-01-20 ENCOUNTER — Encounter: Payer: Self-pay | Admitting: Family Medicine

## 2022-01-21 NOTE — Telephone Encounter (Signed)
Yes he should stop it for 2 weeks and see what happens

## 2022-08-25 DIAGNOSIS — L814 Other melanin hyperpigmentation: Secondary | ICD-10-CM | POA: Diagnosis not present

## 2022-08-25 DIAGNOSIS — L821 Other seborrheic keratosis: Secondary | ICD-10-CM | POA: Diagnosis not present

## 2022-08-25 DIAGNOSIS — L570 Actinic keratosis: Secondary | ICD-10-CM | POA: Diagnosis not present

## 2022-08-25 DIAGNOSIS — D225 Melanocytic nevi of trunk: Secondary | ICD-10-CM | POA: Diagnosis not present

## 2022-09-29 ENCOUNTER — Encounter: Payer: Self-pay | Admitting: Family Medicine

## 2022-09-29 ENCOUNTER — Ambulatory Visit (INDEPENDENT_AMBULATORY_CARE_PROVIDER_SITE_OTHER): Payer: BC Managed Care – PPO | Admitting: Family Medicine

## 2022-09-29 VITALS — BP 110/78 | HR 61 | Temp 97.7°F | Ht 73.5 in | Wt 191.2 lb

## 2022-09-29 DIAGNOSIS — Z0000 Encounter for general adult medical examination without abnormal findings: Secondary | ICD-10-CM | POA: Diagnosis not present

## 2022-09-29 LAB — BASIC METABOLIC PANEL
BUN: 15 mg/dL (ref 6–23)
CO2: 32 mEq/L (ref 19–32)
Calcium: 9.3 mg/dL (ref 8.4–10.5)
Chloride: 99 mEq/L (ref 96–112)
Creatinine, Ser: 0.97 mg/dL (ref 0.40–1.50)
GFR: 87.96 mL/min (ref >60.00)
Glucose, Bld: 101 mg/dL — ABNORMAL HIGH (ref 70–99)
Potassium: 4 mEq/L (ref 3.5–5.1)
Sodium: 138 mEq/L (ref 135–145)

## 2022-09-29 LAB — HEPATIC FUNCTION PANEL
ALT: 19 U/L (ref 0–53)
AST: 17 U/L (ref 0–37)
Albumin: 4.7 g/dL (ref 3.5–5.2)
Alkaline Phosphatase: 53 U/L (ref 39–117)
Bilirubin, Direct: 0.1 mg/dL (ref 0.0–0.3)
Total Bilirubin: 0.7 mg/dL (ref 0.2–1.2)
Total Protein: 7 g/dL (ref 6.0–8.3)

## 2022-09-29 LAB — TSH: TSH: 1.87 u[IU]/mL (ref 0.35–5.50)

## 2022-09-29 LAB — PSA: PSA: 0.36 ng/mL (ref 0.10–4.00)

## 2022-09-29 LAB — LIPID PANEL
Cholesterol: 128 mg/dL (ref 0–200)
HDL: 44.7 mg/dL (ref >39.00)
LDL Cholesterol: 75 mg/dL (ref 0–99)
NonHDL: 83.15
Total CHOL/HDL Ratio: 3
Triglycerides: 42 mg/dL (ref 0.0–149.0)
VLDL: 8.4 mg/dL (ref 0.0–40.0)

## 2022-09-29 LAB — CBC WITH DIFFERENTIAL/PLATELET
Basophils Absolute: 0 10*3/uL (ref 0.0–0.1)
Basophils Relative: 1.1 % (ref 0.0–3.0)
Eosinophils Absolute: 0.1 10*3/uL (ref 0.0–0.7)
Eosinophils Relative: 2 % (ref 0.0–5.0)
HCT: 47.2 % (ref 39.0–52.0)
Hemoglobin: 15.8 g/dL (ref 13.0–17.0)
Lymphocytes Relative: 31.2 % (ref 12.0–46.0)
Lymphs Abs: 1.2 10*3/uL (ref 0.7–4.0)
MCHC: 33.5 g/dL (ref 30.0–36.0)
MCV: 89.9 fl (ref 78.0–100.0)
Monocytes Absolute: 0.5 10*3/uL (ref 0.1–1.0)
Monocytes Relative: 13 % — ABNORMAL HIGH (ref 3.0–12.0)
Neutro Abs: 2.1 10*3/uL (ref 1.4–7.7)
Neutrophils Relative %: 52.7 % (ref 43.0–77.0)
Platelets: 172 10*3/uL (ref 150.0–400.0)
RBC: 5.25 Mil/uL (ref 4.22–5.81)
RDW: 14 % (ref 11.5–15.5)
WBC: 3.9 10*3/uL — ABNORMAL LOW (ref 4.0–10.5)

## 2022-09-29 LAB — HEMOGLOBIN A1C: Hgb A1c MFr Bld: 5.9 % (ref 4.6–6.5)

## 2022-09-29 MED ORDER — ATORVASTATIN CALCIUM 20 MG PO TABS: 20.0000 mg | ORAL_TABLET | Freq: Every day | ORAL | 3 refills | Status: DC

## 2022-09-29 MED ORDER — OMEPRAZOLE 20 MG PO CPDR: DELAYED_RELEASE_CAPSULE | ORAL | 3 refills | Status: DC

## 2022-09-29 MED ORDER — HYDROCHLOROTHIAZIDE 12.5 MG PO CAPS: 12.5000 mg | ORAL_CAPSULE | Freq: Every day | ORAL | 3 refills | Status: DC

## 2022-09-29 NOTE — Progress Notes (Signed)
Subjective:    Patient ID: Sean Marshall, male    DOB: [birth year], 56 y.o.   MRN: [id number]  HPI Here for a well exam. He feels fine. He has changed his diet and has lost 20 lbs in the past year. He stopped taking the Lisinopril about 6 months ago.    Review of Systems  Constitutional: Negative.   HENT: Negative.    Eyes: Negative.   Respiratory: Negative.    Cardiovascular: Negative.   Gastrointestinal: Negative.   Genitourinary: Negative.   Musculoskeletal: Negative.   Skin: Negative.   Neurological: Negative.   Psychiatric/Behavioral: Negative.         Objective:   Physical Exam Constitutional:      General: He is not in acute distress.    Appearance: Normal appearance. He is well-developed. He is not diaphoretic.  HENT:     Head: Normocephalic and atraumatic.     Right Ear: External ear normal.     Left Ear: External ear normal.     Nose: Nose normal.     Mouth/Throat:     Pharynx: No oropharyngeal exudate.  Eyes:     General: No scleral icterus.       Right eye: No discharge.        Left eye: No discharge.     Conjunctiva/sclera: Conjunctivae normal.     Pupils: Pupils are equal, round, and reactive to light.  Neck:     Thyroid: No thyromegaly.     Vascular: No JVD.     Trachea: No tracheal deviation.  Cardiovascular:     Rate and Rhythm: Normal rate and regular rhythm.     Heart sounds: Normal heart sounds. No murmur heard.    No friction rub. No gallop.  Pulmonary:     Effort: Pulmonary effort is normal. No respiratory distress.     Breath sounds: Normal breath sounds. No wheezing or rales.  Chest:     Chest wall: No tenderness.  Abdominal:     General: Bowel sounds are normal. There is no distension.     Palpations: Abdomen is soft. There is no mass.     Tenderness: There is no abdominal tenderness. There is no guarding or rebound.     Comments: Medium sized non-reducible non-tender direct right inguinal hernia   Genitourinary:    Penis: Normal.  No tenderness.      Testes: Normal.     Prostate: Normal.     Rectum: Normal. Guaiac result negative.  Musculoskeletal:        General: No tenderness. Normal range of motion.     Cervical back: Neck supple.  Lymphadenopathy:     Cervical: No cervical adenopathy.  Skin:    General: Skin is warm and dry.     Coloration: Skin is not pale.     Findings: No erythema or rash.  Neurological:     Mental Status: He is alert and oriented to person, place, and time.     Cranial Nerves: No cranial nerve deficit.     Motor: No abnormal muscle tone.     Coordination: Coordination normal.     Deep Tendon Reflexes: Reflexes are normal and symmetric. Reflexes normal.  Psychiatric:        Behavior: Behavior normal.        Thought Content: Thought content normal.        Judgment: Judgment normal.           Assessment & Plan:  Well exam. We  discussed diet and exercise. Get fasting labs. His BP is much lower since he lost weight. We will stay off Lisinopril and we will the HCTZ to 12.5 mg daily. He saw Dr. Derrell Lolling last year about the hernia, and they decided to wait a year for the surgery. Hearl will contact Dr. Derrell Lolling now to set this up.  Gershon Crane, MD

## 2022-10-17 ENCOUNTER — Other Ambulatory Visit: Payer: Self-pay | Admitting: Family Medicine

## 2022-11-01 ENCOUNTER — Encounter: Payer: Self-pay | Admitting: Family Medicine

## 2022-11-03 DIAGNOSIS — K4020 Bilateral inguinal hernia, without obstruction or gangrene, not specified as recurrent: Secondary | ICD-10-CM | POA: Diagnosis not present

## 2022-11-03 NOTE — Telephone Encounter (Signed)
Increase the HCTZ to 25 mg daily. Call in one year supply

## 2022-11-04 ENCOUNTER — Other Ambulatory Visit: Payer: Self-pay

## 2022-11-04 DIAGNOSIS — I10 Essential (primary) hypertension: Secondary | ICD-10-CM

## 2022-11-04 MED ORDER — HYDROCHLOROTHIAZIDE 25 MG PO TABS: 25.0000 mg | ORAL_TABLET | Freq: Every day | ORAL | 3 refills | Status: DC

## 2023-01-13 DIAGNOSIS — K4020 Bilateral inguinal hernia, without obstruction or gangrene, not specified as recurrent: Secondary | ICD-10-CM | POA: Diagnosis not present

## 2023-01-13 HISTORY — PX: LAPAROSCOPIC INGUINAL HERNIA REPAIR: SUR788

## 2023-03-05 DIAGNOSIS — L821 Other seborrheic keratosis: Secondary | ICD-10-CM | POA: Diagnosis not present

## 2023-03-05 DIAGNOSIS — L814 Other melanin hyperpigmentation: Secondary | ICD-10-CM | POA: Diagnosis not present

## 2023-03-05 DIAGNOSIS — L570 Actinic keratosis: Secondary | ICD-10-CM | POA: Diagnosis not present

## 2023-03-05 DIAGNOSIS — D225 Melanocytic nevi of trunk: Secondary | ICD-10-CM | POA: Diagnosis not present

## 2023-03-05 DIAGNOSIS — L720 Epidermal cyst: Secondary | ICD-10-CM | POA: Diagnosis not present

## 2023-09-07 DIAGNOSIS — D492 Neoplasm of unspecified behavior of bone, soft tissue, and skin: Secondary | ICD-10-CM | POA: Diagnosis not present

## 2023-09-07 DIAGNOSIS — L814 Other melanin hyperpigmentation: Secondary | ICD-10-CM | POA: Diagnosis not present

## 2023-09-07 DIAGNOSIS — D225 Melanocytic nevi of trunk: Secondary | ICD-10-CM | POA: Diagnosis not present

## 2023-09-07 DIAGNOSIS — L570 Actinic keratosis: Secondary | ICD-10-CM | POA: Diagnosis not present

## 2023-09-07 DIAGNOSIS — L821 Other seborrheic keratosis: Secondary | ICD-10-CM | POA: Diagnosis not present

## 2023-09-16 ENCOUNTER — Other Ambulatory Visit: Payer: Self-pay | Admitting: Family Medicine

## 2023-10-06 ENCOUNTER — Ambulatory Visit (INDEPENDENT_AMBULATORY_CARE_PROVIDER_SITE_OTHER): Payer: BC Managed Care – PPO | Admitting: Family Medicine

## 2023-10-06 ENCOUNTER — Encounter: Payer: Self-pay | Admitting: Family Medicine

## 2023-10-06 VITALS — BP 110/78 | HR 56 | Temp 97.7°F | Ht 73.5 in | Wt 192.0 lb

## 2023-10-06 DIAGNOSIS — Z0000 Encounter for general adult medical examination without abnormal findings: Secondary | ICD-10-CM | POA: Diagnosis not present

## 2023-10-06 DIAGNOSIS — I10 Essential (primary) hypertension: Secondary | ICD-10-CM | POA: Diagnosis not present

## 2023-10-06 DIAGNOSIS — Z125 Encounter for screening for malignant neoplasm of prostate: Secondary | ICD-10-CM | POA: Diagnosis not present

## 2023-10-06 LAB — CBC WITH DIFFERENTIAL/PLATELET
Basophils Absolute: 0.1 10*3/uL (ref 0.0–0.1)
Basophils Relative: 1.4 % (ref 0.0–3.0)
Eosinophils Absolute: 0.1 10*3/uL (ref 0.0–0.7)
Eosinophils Relative: 2.4 % (ref 0.0–5.0)
HCT: 45.3 % (ref 39.0–52.0)
Hemoglobin: 15.3 g/dL (ref 13.0–17.0)
Lymphocytes Relative: 26.7 % (ref 12.0–46.0)
Lymphs Abs: 1.2 10*3/uL (ref 0.7–4.0)
MCHC: 33.7 g/dL (ref 30.0–36.0)
MCV: 89.9 fL (ref 78.0–100.0)
Monocytes Absolute: 0.5 10*3/uL (ref 0.1–1.0)
Monocytes Relative: 11.7 % (ref 3.0–12.0)
Neutro Abs: 2.6 10*3/uL (ref 1.4–7.7)
Neutrophils Relative %: 57.8 % (ref 43.0–77.0)
Platelets: 176 10*3/uL (ref 150.0–400.0)
RBC: 5.04 Mil/uL (ref 4.22–5.81)
RDW: 13.7 % (ref 11.5–15.5)
WBC: 4.6 10*3/uL (ref 4.0–10.5)

## 2023-10-06 LAB — HEPATIC FUNCTION PANEL
ALT: 21 U/L (ref 0–53)
AST: 20 U/L (ref 0–37)
Albumin: 4.6 g/dL (ref 3.5–5.2)
Alkaline Phosphatase: 55 U/L (ref 39–117)
Bilirubin, Direct: 0.2 mg/dL (ref 0.0–0.3)
Total Bilirubin: 0.8 mg/dL (ref 0.2–1.2)
Total Protein: 7.1 g/dL (ref 6.0–8.3)

## 2023-10-06 LAB — HEMOGLOBIN A1C: Hgb A1c MFr Bld: 5.8 % (ref 4.6–6.5)

## 2023-10-06 LAB — BASIC METABOLIC PANEL
BUN: 12 mg/dL (ref 6–23)
CO2: 34 meq/L — ABNORMAL HIGH (ref 19–32)
Calcium: 9 mg/dL (ref 8.4–10.5)
Chloride: 99 meq/L (ref 96–112)
Creatinine, Ser: 0.98 mg/dL (ref 0.40–1.50)
GFR: 86.26 mL/min (ref >60.00)
Glucose, Bld: 98 mg/dL (ref 70–99)
Potassium: 3.5 meq/L (ref 3.5–5.1)
Sodium: 140 meq/L (ref 135–145)

## 2023-10-06 LAB — TSH: TSH: 1.58 u[IU]/mL (ref 0.35–5.50)

## 2023-10-06 LAB — LIPID PANEL
Cholesterol: 129 mg/dL (ref 0–200)
HDL: 47.1 mg/dL (ref >39.00)
LDL Cholesterol: 74 mg/dL (ref 0–99)
NonHDL: 82.39
Total CHOL/HDL Ratio: 3
Triglycerides: 44 mg/dL (ref 0.0–149.0)
VLDL: 8.8 mg/dL (ref 0.0–40.0)

## 2023-10-06 LAB — PSA: PSA: 3.72 ng/mL (ref 0.10–4.00)

## 2023-10-06 MED ORDER — HYDROCHLOROTHIAZIDE 25 MG PO TABS: 25.0000 mg | ORAL_TABLET | Freq: Every day | ORAL | 3 refills | Status: DC

## 2023-10-06 NOTE — Progress Notes (Signed)
Subjective:    Patient ID: Sean Marshall, male    DOB: 27-Nov-1966, 57 y.o.   MRN: [id number]  HPI Here for a well exam. He feels great. He had a bilateral inguinal hernia repair in February, and this went very well.    Review of Systems  Constitutional: Negative.   HENT: Negative.    Eyes: Negative.   Respiratory: Negative.    Cardiovascular: Negative.   Gastrointestinal: Negative.   Genitourinary: Negative.   Musculoskeletal: Negative.   Skin: Negative.   Neurological: Negative.   Psychiatric/Behavioral: Negative.         Objective:   Physical Exam Constitutional:      General: He is not in acute distress.    Appearance: Normal appearance. He is well-developed. He is not diaphoretic.  HENT:     Head: Normocephalic and atraumatic.     Right Ear: External ear normal.     Left Ear: External ear normal.     Nose: Nose normal.     Mouth/Throat:     Pharynx: No oropharyngeal exudate.  Eyes:     General: No scleral icterus.       Right eye: No discharge.        Left eye: No discharge.     Conjunctiva/sclera: Conjunctivae normal.     Pupils: Pupils are equal, round, and reactive to light.  Neck:     Thyroid: No thyromegaly.     Vascular: No JVD.     Trachea: No tracheal deviation.  Cardiovascular:     Rate and Rhythm: Normal rate and regular rhythm.     Pulses: Normal pulses.     Heart sounds: Normal heart sounds. No murmur heard.    No friction rub. No gallop.  Pulmonary:     Effort: Pulmonary effort is normal. No respiratory distress.     Breath sounds: Normal breath sounds. No wheezing or rales.  Chest:     Chest wall: No tenderness.  Abdominal:     General: Bowel sounds are normal. There is no distension.     Palpations: Abdomen is soft. There is no mass.     Tenderness: There is no abdominal tenderness. There is no guarding or rebound.  Genitourinary:    Penis: Normal. No tenderness.      Testes: Normal.     Prostate: Normal.     Rectum: Normal. Guaiac  result negative.  Musculoskeletal:        General: No tenderness. Normal range of motion.     Cervical back: Neck supple.  Lymphadenopathy:     Cervical: No cervical adenopathy.  Skin:    General: Skin is warm and dry.     Coloration: Skin is not pale.     Findings: No erythema or rash.  Neurological:     General: No focal deficit present.     Mental Status: He is alert and oriented to person, place, and time.     Cranial Nerves: No cranial nerve deficit.     Motor: No abnormal muscle tone.     Coordination: Coordination normal.     Deep Tendon Reflexes: Reflexes are normal and symmetric. Reflexes normal.  Psychiatric:        Mood and Affect: Mood normal.        Behavior: Behavior normal.        Thought Content: Thought content normal.        Judgment: Judgment normal.           Assessment &  Plan:  Well exam. We discussed diet and exercise. Get fasting labs. Gershon Crane, MD

## 2023-10-12 ENCOUNTER — Encounter: Payer: Self-pay | Admitting: Family Medicine

## 2023-10-14 NOTE — Telephone Encounter (Signed)
This is probably nothing to worry since the PSA will fluctuate up and down sometimes. However to make sure this is not a trend, let's get another PSA in 6 months

## 2024-03-08 DIAGNOSIS — L570 Actinic keratosis: Secondary | ICD-10-CM | POA: Diagnosis not present

## 2024-03-08 DIAGNOSIS — L821 Other seborrheic keratosis: Secondary | ICD-10-CM | POA: Diagnosis not present

## 2024-03-08 DIAGNOSIS — Z872 Personal history of diseases of the skin and subcutaneous tissue: Secondary | ICD-10-CM | POA: Diagnosis not present

## 2024-03-08 DIAGNOSIS — D225 Melanocytic nevi of trunk: Secondary | ICD-10-CM | POA: Diagnosis not present

## 2024-03-08 DIAGNOSIS — L814 Other melanin hyperpigmentation: Secondary | ICD-10-CM | POA: Diagnosis not present

## 2024-05-20 ENCOUNTER — Encounter: Payer: Self-pay | Admitting: Family Medicine

## 2024-05-20 ENCOUNTER — Ambulatory Visit: Admitting: Family Medicine

## 2024-05-20 VITALS — BP 124/76 | HR 55 | Temp 98.1°F | Wt 190.0 lb

## 2024-05-20 DIAGNOSIS — K529 Noninfective gastroenteritis and colitis, unspecified: Secondary | ICD-10-CM

## 2024-05-20 MED ORDER — CIPROFLOXACIN HCL 500 MG PO TABS: 500.0000 mg | ORAL_TABLET | Freq: Two times a day (BID) | ORAL | 0 refills | Status: AC

## 2024-05-20 NOTE — Progress Notes (Signed)
    Subjective:    Patient ID: Sean Marshall, male    DOB: 1966-11-14, 58 y.o.   MRN: [id number]  HPI Here for 3 weeks of intermittent lower abdominal cramps, diarrhea, and passing gas. No fever or nausea. Appetite is normal. No recent travel. He is drinking fluids.    Review of Systems  Constitutional: Negative.   Respiratory: Negative.    Cardiovascular: Negative.   Gastrointestinal:  Positive for abdominal pain and diarrhea. Negative for abdominal distention, blood in stool, constipation, nausea and vomiting.  Genitourinary: Negative.        Objective:   Physical Exam Constitutional:      Appearance: Normal appearance. He is not ill-appearing.   Cardiovascular:     Rate and Rhythm: Normal rate and regular rhythm.     Pulses: Normal pulses.     Heart sounds: Normal heart sounds.  Pulmonary:     Effort: Pulmonary effort is normal.     Breath sounds: Normal breath sounds.  Abdominal:     General: Abdomen is flat. There is no distension.     Palpations: Abdomen is soft. There is no mass.     Tenderness: There is no right CVA tenderness, left CVA tenderness, guarding or rebound.     Hernia: No hernia is present.     Comments: Mild suprapubic tenderness    Neurological:     Mental Status: He is alert.           Assessment & Plan:  Enteritis, treat with 10 days of Cipro. Corita Diego, MD

## 2024-09-08 ENCOUNTER — Other Ambulatory Visit: Payer: Self-pay | Admitting: Family Medicine

## 2024-09-20 DIAGNOSIS — L821 Other seborrheic keratosis: Secondary | ICD-10-CM | POA: Diagnosis not present

## 2024-09-20 DIAGNOSIS — D492 Neoplasm of unspecified behavior of bone, soft tissue, and skin: Secondary | ICD-10-CM | POA: Diagnosis not present

## 2024-09-20 DIAGNOSIS — D0461 Carcinoma in situ of skin of right upper limb, including shoulder: Secondary | ICD-10-CM | POA: Diagnosis not present

## 2024-09-20 DIAGNOSIS — L814 Other melanin hyperpigmentation: Secondary | ICD-10-CM | POA: Diagnosis not present

## 2024-09-20 DIAGNOSIS — D1801 Hemangioma of skin and subcutaneous tissue: Secondary | ICD-10-CM | POA: Diagnosis not present

## 2024-09-20 DIAGNOSIS — L570 Actinic keratosis: Secondary | ICD-10-CM | POA: Diagnosis not present

## 2024-09-20 DIAGNOSIS — Z872 Personal history of diseases of the skin and subcutaneous tissue: Secondary | ICD-10-CM | POA: Diagnosis not present

## 2024-10-06 ENCOUNTER — Ambulatory Visit: Payer: Self-pay | Admitting: Family Medicine

## 2024-10-06 ENCOUNTER — Ambulatory Visit (INDEPENDENT_AMBULATORY_CARE_PROVIDER_SITE_OTHER): Admitting: Family Medicine

## 2024-10-06 ENCOUNTER — Encounter: Payer: Self-pay | Admitting: Family Medicine

## 2024-10-06 VITALS — BP 110/78 | HR 56 | Temp 97.8°F | Ht 74.0 in | Wt 194.0 lb

## 2024-10-06 DIAGNOSIS — Z0000 Encounter for general adult medical examination without abnormal findings: Secondary | ICD-10-CM | POA: Diagnosis not present

## 2024-10-06 LAB — CBC WITH DIFFERENTIAL/PLATELET
Basophils Absolute: 0 K/uL (ref 0.0–0.1)
Basophils Relative: 1.2 % (ref 0.0–3.0)
Eosinophils Absolute: 0.1 K/uL (ref 0.0–0.7)
Eosinophils Relative: 2.7 % (ref 0.0–5.0)
HCT: 46.6 % (ref 39.0–52.0)
Hemoglobin: 15.8 g/dL (ref 13.0–17.0)
Lymphocytes Relative: 31 % (ref 12.0–46.0)
Lymphs Abs: 1.2 K/uL (ref 0.7–4.0)
MCHC: 33.9 g/dL (ref 30.0–36.0)
MCV: 89.5 fl (ref 78.0–100.0)
Monocytes Absolute: 0.5 K/uL (ref 0.1–1.0)
Monocytes Relative: 12.5 % — ABNORMAL HIGH (ref 3.0–12.0)
Neutro Abs: 2 K/uL (ref 1.4–7.7)
Neutrophils Relative %: 52.6 % (ref 43.0–77.0)
Platelets: 173 K/uL (ref 150.0–400.0)
RBC: 5.2 Mil/uL (ref 4.22–5.81)
RDW: 13.9 % (ref 11.5–15.5)
WBC: 3.8 K/uL — ABNORMAL LOW (ref 4.0–10.5)

## 2024-10-06 LAB — LIPID PANEL
Cholesterol: 126 mg/dL (ref 0–200)
HDL: 47.4 mg/dL (ref >39.00)
LDL Cholesterol: 71 mg/dL (ref 0–99)
NonHDL: 78.89
Total CHOL/HDL Ratio: 3
Triglycerides: 39 mg/dL (ref 0.0–149.0)
VLDL: 7.8 mg/dL (ref 0.0–40.0)

## 2024-10-06 LAB — URINALYSIS
Bilirubin Urine: NEGATIVE
Hgb urine dipstick: NEGATIVE
Ketones, ur: NEGATIVE
Leukocytes,Ua: NEGATIVE
Nitrite: NEGATIVE
Specific Gravity, Urine: <=1.005 — AB (ref 1.000–1.030)
Total Protein, Urine: NEGATIVE
Urine Glucose: NEGATIVE
Urobilinogen, UA: 0.2 (ref 0.0–1.0)
pH: 7 (ref 5.0–8.0)

## 2024-10-06 LAB — BASIC METABOLIC PANEL WITH GFR
BUN: 14 mg/dL (ref 6–23)
CO2: 33 meq/L — ABNORMAL HIGH (ref 19–32)
Calcium: 8.9 mg/dL (ref 8.4–10.5)
Chloride: 99 meq/L (ref 96–112)
Creatinine, Ser: 0.95 mg/dL (ref 0.40–1.50)
GFR: 88.91 mL/min (ref >60.00)
Glucose, Bld: 93 mg/dL (ref 70–99)
Potassium: 4 meq/L (ref 3.5–5.1)
Sodium: 139 meq/L (ref 135–145)

## 2024-10-06 LAB — HEPATIC FUNCTION PANEL
ALT: 20 U/L (ref 0–53)
AST: 17 U/L (ref 0–37)
Albumin: 4.6 g/dL (ref 3.5–5.2)
Alkaline Phosphatase: 53 U/L (ref 39–117)
Bilirubin, Direct: 0.2 mg/dL (ref 0.0–0.3)
Total Bilirubin: 0.9 mg/dL (ref 0.2–1.2)
Total Protein: 6.9 g/dL (ref 6.0–8.3)

## 2024-10-06 LAB — TSH: TSH: 1.49 u[IU]/mL (ref 0.35–5.50)

## 2024-10-06 LAB — HEMOGLOBIN A1C: Hgb A1c MFr Bld: 5.7 % (ref 4.6–6.5)

## 2024-10-06 LAB — PSA: PSA: 0.71 ng/mL (ref 0.10–4.00)

## 2024-10-06 MED ORDER — HYDROCHLOROTHIAZIDE 12.5 MG PO CAPS: 12.5000 mg | ORAL_CAPSULE | Freq: Every day | ORAL | 3 refills | Status: AC

## 2024-10-06 NOTE — Progress Notes (Signed)
 Subjective:    Patient ID: Sean Marshall, male    DOB: 15-Nov-1966, 58 y.o.   MRN: [id number]  HPI Here for a well exam. He feels well. 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 Rome Pereyra a few months ago, and it looks like this is growing back.    Review of Systems  Constitutional: Negative.   HENT: Negative.    Eyes: Negative.   Respiratory: Negative.    Cardiovascular: Negative.   Gastrointestinal: Negative.   Genitourinary: Negative.   Musculoskeletal: Negative.   Skin: Negative.   Neurological: Negative.   Psychiatric/Behavioral: Negative.         Objective:   Physical Exam Constitutional:      General: He is not in acute distress.    Appearance: Normal appearance. He is well-developed. He is not diaphoretic.  HENT:     Head: Normocephalic and atraumatic.     Right Ear: External ear normal.     Left Ear: External ear normal.     Nose: Nose normal.     Mouth/Throat:     Pharynx: No oropharyngeal exudate.  Eyes:     General: No scleral icterus.       Right eye: No discharge.        Left eye: No discharge.     Conjunctiva/sclera: Conjunctivae normal.     Pupils: Pupils are equal, round, and reactive to light.  Neck:     Thyroid : No thyromegaly.     Vascular: No JVD.     Trachea: No tracheal deviation.  Cardiovascular:     Rate and Rhythm: Normal rate and regular rhythm.     Pulses: Normal pulses.     Heart sounds: Normal heart sounds. No murmur heard.    No friction rub. No gallop.  Pulmonary:     Effort: Pulmonary effort is normal. No respiratory distress.     Breath sounds: Normal breath sounds. No wheezing or rales.  Chest:     Chest wall: No tenderness.  Abdominal:     General: Bowel sounds are normal. There is no distension.     Palpations: Abdomen is soft. There is no mass.     Tenderness: There is no abdominal tenderness. There is no guarding or rebound.  Genitourinary:    Penis: Normal. No tenderness.      Testes:  Normal.     Prostate: Normal.     Rectum: Normal. Guaiac result negative.  Musculoskeletal:        General: No tenderness. Normal range of motion.     Cervical back: Neck supple.  Lymphadenopathy:     Cervical: No cervical adenopathy.  Skin:    General: Skin is warm and dry.     Coloration: Skin is not pale.     Findings: No erythema or rash.     Comments: The dorsal right forearm has an umbilicated pinkish lesion about 1 cm in diameter   Neurological:     General: No focal deficit present.     Mental Status: He is alert and oriented to person, place, and time.     Cranial Nerves: No cranial nerve deficit.     Motor: No abnormal muscle tone.     Coordination: Coordination normal.     Deep Tendon Reflexes: Reflexes are normal and symmetric. Reflexes normal.  Psychiatric:        Mood and Affect: Mood normal.        Behavior: Behavior normal.  Thought Content: Thought content normal.        Judgment: Judgment normal.           Assessment & Plan:  Well exam. We discussed diet and exercise. Get fasting labs. He will follow up with Dr. Elnor soon for the basal cell cancer.  Garnette Olmsted, MD

## 2024-11-10 DIAGNOSIS — D0461 Carcinoma in situ of skin of right upper limb, including shoulder: Secondary | ICD-10-CM | POA: Diagnosis not present

## 2024-11-10 DIAGNOSIS — L570 Actinic keratosis: Secondary | ICD-10-CM | POA: Diagnosis not present
